# Patient Record
Sex: Male | Born: 1955 | Race: White | Hispanic: No | Marital: Married | Smoking: Never smoker
Health system: Southern US, Community
[De-identification: ages and names within clinical notes are randomized; demographics above are authoritative.]

## PROBLEM LIST (undated history)

## (undated) DIAGNOSIS — E7800 Pure hypercholesterolemia, unspecified: Secondary | ICD-10-CM

## (undated) DIAGNOSIS — M109 Gout, unspecified: Secondary | ICD-10-CM

## (undated) HISTORY — PX: APPENDECTOMY: SHX54

---

## 2006-10-06 ENCOUNTER — Encounter: Admission: RE | Admit: 2006-10-06 | Discharge: 2006-10-06 | Payer: Self-pay | Admitting: Family Medicine

## 2007-03-09 ENCOUNTER — Encounter: Admission: RE | Admit: 2007-03-09 | Discharge: 2007-03-09 | Payer: Self-pay | Admitting: Family Medicine

## 2012-12-28 ENCOUNTER — Other Ambulatory Visit: Payer: Self-pay | Admitting: Family Medicine

## 2012-12-28 ENCOUNTER — Ambulatory Visit
Admission: RE | Admit: 2012-12-28 | Discharge: 2012-12-28 | Disposition: A | Discharge status: Home / Self Care | Payer: BC Managed Care – PPO | Source: Ambulatory Visit | Attending: Family Medicine | Admitting: Family Medicine

## 2012-12-28 DIAGNOSIS — R609 Edema, unspecified: Secondary | ICD-10-CM

## 2012-12-28 DIAGNOSIS — T1490XA Injury, unspecified, initial encounter: Secondary | ICD-10-CM

## 2012-12-28 DIAGNOSIS — R52 Pain, unspecified: Secondary | ICD-10-CM

## 2013-04-16 ENCOUNTER — Other Ambulatory Visit: Payer: Self-pay | Admitting: Physician Assistant

## 2013-04-16 ENCOUNTER — Ambulatory Visit
Admission: RE | Admit: 2013-04-16 | Discharge: 2013-04-16 | Disposition: A | Discharge status: Home / Self Care | Payer: BC Managed Care – PPO | Source: Ambulatory Visit | Attending: Physician Assistant | Admitting: Physician Assistant

## 2013-04-16 DIAGNOSIS — R609 Edema, unspecified: Secondary | ICD-10-CM

## 2013-04-16 DIAGNOSIS — R52 Pain, unspecified: Secondary | ICD-10-CM

## 2014-06-16 ENCOUNTER — Emergency Department (HOSPITAL_COMMUNITY): Payer: BLUE CROSS/BLUE SHIELD

## 2014-06-16 ENCOUNTER — Encounter (HOSPITAL_COMMUNITY): Payer: Self-pay | Admitting: Emergency Medicine

## 2014-06-16 ENCOUNTER — Emergency Department (HOSPITAL_COMMUNITY)
Admission: EM | Admit: 2014-06-16 | Discharge: 2014-06-16 | Disposition: A | Discharge status: Home / Self Care | Payer: BLUE CROSS/BLUE SHIELD | Attending: Emergency Medicine | Admitting: Emergency Medicine

## 2014-06-16 DIAGNOSIS — K529 Noninfective gastroenteritis and colitis, unspecified: Secondary | ICD-10-CM | POA: Diagnosis not present

## 2014-06-16 DIAGNOSIS — Z8639 Personal history of other endocrine, nutritional and metabolic disease: Secondary | ICD-10-CM | POA: Diagnosis not present

## 2014-06-16 DIAGNOSIS — R109 Unspecified abdominal pain: Secondary | ICD-10-CM | POA: Diagnosis present

## 2014-06-16 DIAGNOSIS — Z79899 Other long term (current) drug therapy: Secondary | ICD-10-CM | POA: Diagnosis not present

## 2014-06-16 DIAGNOSIS — Z8739 Personal history of other diseases of the musculoskeletal system and connective tissue: Secondary | ICD-10-CM | POA: Insufficient documentation

## 2014-06-16 DIAGNOSIS — Z9089 Acquired absence of other organs: Secondary | ICD-10-CM | POA: Diagnosis not present

## 2014-06-16 HISTORY — DX: Pure hypercholesterolemia, unspecified: E78.00

## 2014-06-16 HISTORY — DX: Gout, unspecified: M10.9

## 2014-06-16 LAB — URINALYSIS, ROUTINE W REFLEX MICROSCOPIC
Bilirubin Urine: NEGATIVE
Glucose, UA: NEGATIVE mg/dL
Hgb urine dipstick: NEGATIVE
KETONES UR: NEGATIVE mg/dL
Leukocytes, UA: NEGATIVE
NITRITE: NEGATIVE
PH: 6 (ref 5.0–8.0)
PROTEIN: NEGATIVE mg/dL
SPECIFIC GRAVITY, URINE: 1.037 — AB (ref 1.005–1.030)
UROBILINOGEN UA: 0.2 mg/dL (ref 0.0–1.0)

## 2014-06-16 LAB — CBC WITH DIFFERENTIAL/PLATELET
BASOS PCT: 1 % (ref 0–1)
Basophils Absolute: 0.1 10*3/uL (ref 0.0–0.1)
EOS ABS: 0 10*3/uL (ref 0.0–0.7)
EOS PCT: 0 % (ref 0–5)
HEMATOCRIT: 44.9 % (ref 39.0–52.0)
Hemoglobin: 15.9 g/dL (ref 13.0–17.0)
LYMPHS ABS: 0.9 10*3/uL (ref 0.7–4.0)
LYMPHS PCT: 6 % — AB (ref 12–46)
MCH: 30.3 pg (ref 26.0–34.0)
MCHC: 35.4 g/dL (ref 30.0–36.0)
MCV: 85.5 fL (ref 78.0–100.0)
MONO ABS: 0.7 10*3/uL (ref 0.1–1.0)
MONOS PCT: 4 % (ref 3–12)
NEUTROS PCT: 89 % — AB (ref 43–77)
Neutro Abs: 14.8 10*3/uL — ABNORMAL HIGH (ref 1.7–7.7)
PLATELETS: 325 10*3/uL (ref 150–400)
RBC: 5.25 MIL/uL (ref 4.22–5.81)
RDW: 12.2 % (ref 11.5–15.5)
WBC: 16.6 10*3/uL — AB (ref 4.0–10.5)

## 2014-06-16 LAB — COMPREHENSIVE METABOLIC PANEL
ALBUMIN: 4.3 g/dL (ref 3.5–5.2)
ALK PHOS: 62 U/L (ref 39–117)
ALT: 33 U/L (ref 0–53)
AST: 30 U/L (ref 0–37)
Anion gap: 7 (ref 5–15)
BILIRUBIN TOTAL: 0.5 mg/dL (ref 0.3–1.2)
BUN: 15 mg/dL (ref 6–23)
CHLORIDE: 103 mmol/L (ref 96–112)
CO2: 27 mmol/L (ref 19–32)
Calcium: 9.3 mg/dL (ref 8.4–10.5)
Creatinine, Ser: 1 mg/dL (ref 0.50–1.35)
GFR calc Af Amer: >90 mL/min (ref >90)
GFR calc non Af Amer: 81 mL/min — ABNORMAL LOW (ref >90)
GLUCOSE: 153 mg/dL — AB (ref 70–99)
POTASSIUM: 4.2 mmol/L (ref 3.5–5.1)
SODIUM: 137 mmol/L (ref 135–145)
Total Protein: 7.6 g/dL (ref 6.0–8.3)

## 2014-06-16 LAB — LIPASE, BLOOD: LIPASE: 33 U/L (ref 11–59)

## 2014-06-16 LAB — I-STAT TROPONIN, ED: TROPONIN I, POC: 0.01 ng/mL (ref 0.00–0.08)

## 2014-06-16 MED ORDER — IOHEXOL 300 MG/ML  SOLN
100.0000 mL | Freq: Once | INTRAMUSCULAR | Status: AC | PRN
  Administered 2014-06-16: 100 mL via INTRAVENOUS

## 2014-06-16 MED ORDER — ONDANSETRON HCL 4 MG/2ML IJ SOLN
4.0000 mg | Freq: Once | INTRAMUSCULAR | Status: AC
  Administered 2014-06-16: 4 mg via INTRAVENOUS
  Filled 2014-06-16: qty 2

## 2014-06-16 MED ORDER — DICYCLOMINE HCL 20 MG PO TABS: 20.0000 mg | ORAL_TABLET | Freq: Two times a day (BID) | ORAL | Status: AC | PRN

## 2014-06-16 MED ORDER — DICYCLOMINE HCL 10 MG/ML IM SOLN
20.0000 mg | Freq: Once | INTRAMUSCULAR | Status: AC
  Administered 2014-06-16: 20 mg via INTRAMUSCULAR
  Filled 2014-06-16: qty 2

## 2014-06-16 MED ORDER — HYDROMORPHONE HCL 1 MG/ML IJ SOLN
1.0000 mg | Freq: Once | INTRAMUSCULAR | Status: AC
  Administered 2014-06-16: 1 mg via INTRAVENOUS
  Filled 2014-06-16: qty 1

## 2014-06-16 MED ORDER — DICYCLOMINE HCL 10 MG PO CAPS
10.0000 mg | ORAL_CAPSULE | Freq: Once | ORAL | Status: AC
  Administered 2014-06-16: 10 mg via ORAL
  Filled 2014-06-16: qty 1

## 2014-06-16 MED ORDER — HYDROCODONE-ACETAMINOPHEN 5-325 MG PO TABS: 1.0000 | ORAL_TABLET | Freq: Four times a day (QID) | ORAL | Status: DC | PRN

## 2014-06-16 MED ORDER — MORPHINE SULFATE 4 MG/ML IJ SOLN
4.0000 mg | Freq: Once | INTRAMUSCULAR | Status: AC
  Administered 2014-06-16: 4 mg via INTRAVENOUS
  Filled 2014-06-16: qty 1

## 2014-06-16 MED ORDER — IOHEXOL 300 MG/ML  SOLN
25.0000 mL | Freq: Once | INTRAMUSCULAR | Status: AC | PRN
  Administered 2014-06-16: 25 mL via ORAL

## 2014-06-16 MED ORDER — HYDROMORPHONE HCL 1 MG/ML IJ SOLN
0.5000 mg | Freq: Once | INTRAMUSCULAR | Status: AC
  Administered 2014-06-16: 0.5 mg via INTRAVENOUS
  Filled 2014-06-16: qty 1

## 2014-06-16 MED ORDER — ONDANSETRON 8 MG PO TBDP: ORAL_TABLET | ORAL | Status: AC

## 2014-06-16 NOTE — ED Provider Notes (Signed)
CSN: [id number]     Arrival date & time 06/16/14  0543 History   First MD Initiated Contact with Patient 06/16/14 0550     Chief Complaint  Patient presents with  . Abdominal Pain  . Emesis     (Consider location/radiation/quality/duration/timing/severity/associated sxs/prior Treatment) Patient is a 59 y.o. male presenting with abdominal pain and vomiting. The history is provided by the patient, medical records and a significant other. No language interpreter was used.  Abdominal Pain Associated symptoms: diarrhea, nausea and vomiting   Associated symptoms: no chest pain, no constipation, no cough, no dysuria, no fatigue, no fever, no hematuria and no shortness of breath   Emesis Associated symptoms: abdominal pain and diarrhea      Arthur Duncan is a 59 y.o. male  with a hx of gout, high cholesterol and appendcetomy presents to the Emergency Department complaining of gradual, persistent, progressively worsening in her eyes abdominal pain onset 8pm yesterday. Associated symptoms include nausea vomiting and diarrhea. Patient reports approximately 25 episodes of nonbloody nonbilious emesis and 5 episodes of watery diarrhea without melena or hematochezia.  Patient reports that his wife and other family members in the household have had the nor varices in the last several weeks. He reports he called his physician and has attempted to use a Phenergan suppository without relief of his pain or other symptoms. Nothing seems to make his symptoms better or worse. Patient denies fever, chills, headache, neck pain, chest pain, shortness of breath, weakness, dizziness, syncope, dysuria, hematuria.  Patient denies history of peptic ulcer disease, daily alcohol intake or chronic NSAID usage.  Past Medical History  Diagnosis Date  . Gout   . Hypercholesteremia    Past Surgical History  Procedure Laterality Date  . Appendectomy     No family history on file. History  Substance Use Topics  . Smoking  status: Never Smoker   . Smokeless tobacco: Not on file  . Alcohol Use: 1.2 oz/week    2 Glasses of wine per week     Comment: 3 days a week    Review of Systems  Constitutional: Negative for fever, diaphoresis, appetite change, fatigue and unexpected weight change.  HENT: Negative for mouth sores and trouble swallowing.   Respiratory: Negative for cough, chest tightness, shortness of breath, wheezing and stridor.   Cardiovascular: Negative for chest pain and palpitations.  Gastrointestinal: Positive for nausea, vomiting, abdominal pain and diarrhea. Negative for constipation, blood in stool, abdominal distention and rectal pain.  Genitourinary: Negative for dysuria, urgency, frequency, hematuria, flank pain and difficulty urinating.  Musculoskeletal: Negative for back pain, neck pain and neck stiffness.  Skin: Negative for rash.  Neurological: Negative for weakness.  Hematological: Negative for adenopathy.  Psychiatric/Behavioral: Negative for confusion.  All other systems reviewed and are negative.     Allergies  Review of patient's allergies indicates no known allergies.  Home Medications   Prior to Admission medications   Medication Sig Start Date End Date Taking? Authorizing Provider  promethazine (PHENERGAN) 25 MG suppository Place 25 mg rectally every 6 (six) hours as needed for nausea or vomiting.   Yes Historical Provider, MD  dicyclomine (BENTYL) 20 MG tablet Take 1 tablet (20 mg total) by mouth every 12 (twelve) hours as needed (abd cramping). 06/16/14   Izsak Meir, PA-C  HYDROcodone-acetaminophen (NORCO/VICODIN) 5-325 MG per tablet Take 1-2 tablets by mouth every 6 (six) hours as needed for moderate pain or severe pain. 06/16/14   Ismerai Bin, PA-C  ondansetron [facility]  ODT) 8 MG disintegrating tablet 8mg  ODT q4 hours prn nausea 06/16/14   Jem Castro, PA-C   BP 134/77 mmHg  Pulse 64  Temp(Src) 98.2 F (36.8 C) (Oral)  Resp 13  Ht 5\' 8"  (1.727  m)  Wt 178 lb (80.74 kg)  BMI 27.07 kg/m2  SpO2 96% Physical Exam  Constitutional: He appears well-developed and well-nourished. No distress.  Awake, alert, nontoxic appearance  HENT:  Head: Normocephalic and atraumatic.  Mouth/Throat: Oropharynx is clear and moist. No oropharyngeal exudate.  Eyes: Conjunctivae are normal. No scleral icterus.  Neck: Normal range of motion. Neck supple.  Cardiovascular: Normal rate, regular rhythm and intact distal pulses.   Pulmonary/Chest: Effort normal and breath sounds normal. No respiratory distress. He has no wheezes.  Equal chest expansion  Abdominal: Soft. Bowel sounds are normal. He exhibits no distension and no mass. There is generalized tenderness. There is no rebound, no guarding and no CVA tenderness.  Generalized abd tenderness without rebound, guarding or peritoneal signs No CVA tenderness  Musculoskeletal: Normal range of motion. He exhibits no edema.  Neurological: He is alert.  Speech is clear and goal oriented Moves extremities without ataxia  Skin: Skin is warm and dry. He is not diaphoretic.  Psychiatric: He has a normal mood and affect.  Nursing note and vitals reviewed.   ED Course  Procedures (including critical care time) Labs Review Labs Reviewed  CBC WITH DIFFERENTIAL/PLATELET - Abnormal; Notable for the following:    WBC 16.6 (*)    Neutrophils Relative % 89 (*)    Neutro Abs 14.8 (*)    Lymphocytes Relative 6 (*)    All other components within normal limits  COMPREHENSIVE METABOLIC PANEL - Abnormal; Notable for the following:    Glucose, Bld 153 (*)    GFR calc non Af Amer 81 (*)    All other components within normal limits  URINALYSIS, ROUTINE W REFLEX MICROSCOPIC - Abnormal; Notable for the following:    Specific Gravity, Urine 1.037 (*)    All other components within normal limits  LIPASE, BLOOD  I-STAT TROPOININ, ED    Imaging Review Ct Abdomen Pelvis W Contrast  06/16/2014   CLINICAL DATA:  Upper to  mid abdominal pain with emesis and diarrhea for 1 day  EXAM: CT ABDOMEN AND PELVIS WITH CONTRAST  TECHNIQUE: Multidetector CT imaging of the abdomen and pelvis was performed using the standard protocol following bolus administration of intravenous contrast. Oral contrast was also administered.  CONTRAST:  100mL OMNIPAQUE IOHEXOL 300 MG/ML  SOLN  COMPARISON:  None.  FINDINGS: There is mild bibasilar lung atelectatic change. There is a small hiatal hernia. Oral contrast in this area suggests its air may be a degree of gastroesophageal reflux.  No focal liver lesions are identified. Gallbladder is mildly distended. There is no appreciable gallbladder wall thickening. There is no biliary duct dilatation.  Spleen, pancreas, and adrenals appear normal.  There is a cyst arising from the posterior aspect of the left kidney measuring 2.4 x 1.5 cm. There is no other renal mass. There is no hydronephrosis on either side. There is no renal or ureteral calculus on either side.  In the pelvis, the urinary bladder is midline with normal wall thickness. There appears to be a hydrocele in the right scrotum. There is no pelvic mass or pelvic fluid collection. There are scattered sigmoid diverticula without diverticulitis. The appendix is absent.  There is no appreciable bowel obstruction. No free air or portal venous air. There  is no appreciable ascites, adenopathy, or abscess in the abdomen or pelvis. There is no abdominal aortic aneurysm. There are no blastic or lytic bone lesions.  IMPRESSION: Small hiatal hernia with apparent gastroesophageal reflux.  No bowel obstruction.  No abscess.  Appendix absent.  No renal or ureteral calculus.  No hydronephrosis.  Scattered sigmoid diverticula without diverticulitis.  Suspect right scrotal hydrocele.   Electronically Signed   By: Bretta Bang III M.D.   On: 06/16/2014 07:59     EKG Interpretation None      MDM   Final diagnoses:  Abdominal pain  Gastroenteritis   Arthur Duncan presents with nausea, vomiting and diarrhea. Abdomen is soft and generally tender. Patient reports he is very concerned about why he has such abdominal pain. Will obtain labs, imaging and reassess.  7:30AM Patient with leukocytosis of 16.6 but labs otherwise reassuring. In light of patient's persistent vomiting this may be reactive however will await CT scan.  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  No bowel obstruction, abscess, renal or ureteral calculus, hydronephrosis or diverticulitis. Patient status post appendectomy.  Patient reports he feels much better. Will by mouth trial and reassess.  Viral gastroenteritis.  9:16 AM Pt PO trial without emesis but with increase in abdominal pain/cramping. Will repeat dilaudid and trial bentyl.    10:11 AM Pain controlled and patient without further emesis here in the emergency department. He wishes for discharge home. We'll discharge home with Vicodin, Zofran and Bentyl.  Patient is nontoxic, nonseptic appearing, in no apparent distress.  Patient's pain and other symptoms adequately managed in emergency department.  Fluid bolus given.  Labs, imaging and vitals reviewed.  Patient does not meet the SIRS or Sepsis criteria.  On repeat exam patient does not have a surgical abdomin and there are no peritoneal signs.  No indication of appendicitis, bowel obstruction, bowel perforation, cholecystitis, diverticulitis.  Patient discharged home with symptomatic treatment and given strict instructions for follow-up with their primary care physician.    I have personally reviewed patient's vitals, nursing note and any pertinent labs or imaging.  I performed an undressed physical exam.    It has been determined that no acute conditions requiring further emergency intervention are present at this time. The patient/guardian have been advised of the diagnosis and plan. I reviewed all labs and imaging including any potential  incidental findings. We have discussed signs and symptoms that warrant return to the ED and they are listed in the discharge instructions.    Vital signs are stable at discharge.   BP 134/77 mmHg  Pulse 64  Temp(Src) 98.2 F (36.8 C) (Oral)  Resp 13  Ht  (1.727 m)  Wt 178 lb (80.74 kg)  BMI 27.07 kg/m2  SpO2 96%        Dierdre Forth, PA-C 06/16/14 1012  Olivia Mackie, MD 06/16/14 1820

## 2014-06-16 NOTE — Discharge Instructions (Signed)
1. Medications: zofran, vicodin, usual home medications 2. Treatment: rest, drink plenty of fluids, advance diet slowly 3. Follow Up: Please followup with your primary doctor in 2 days for discussion of your diagnoses and further evaluation after today's visit; if you do not have a primary care doctor use the resource guide provided to find one; Please return to the ER for persistent vomiting, high fevers or worsening symptoms   Norovirus Infection Norovirus illness is caused by a viral infection. The term norovirus refers to a group of viruses. Any of those viruses can cause norovirus illness. This illness is often referred to by other names such as viral gastroenteritis, stomach flu, and food poisoning. Anyone can get a norovirus infection. People can have the illness multiple times during their lifetime. CAUSES  Norovirus is found in the stool or vomit of infected people. It is easily spread from person to person (contagious). People with norovirus are contagious from the moment they begin feeling ill. They may remain contagious for as long as 3 days to 2 weeks after recovery. People can become infected with the virus in several ways. This includes:  Eating food or drinking liquids that are contaminated with norovirus.  Touching surfaces or objects contaminated with norovirus, and then placing your hand in your mouth.  Having direct contact with a person who is infected and shows symptoms. This may occur while caring for someone with illness or while sharing foods or eating utensils with someone who is ill. SYMPTOMS  Symptoms usually begin 1 to 2 days after ingestion of the virus. Symptoms may include:  Nausea.  Vomiting.  Diarrhea.  Stomach cramps.  Low-grade fever.  Chills.  Headache.  Muscle aches.  Tiredness. Most people with norovirus illness get better within 1 to 2 days. Some people become dehydrated because they cannot drink enough liquids to replace those lost from  vomiting and diarrhea. This is especially true for young children, the elderly, and others who are unable to care for themselves. DIAGNOSIS  Diagnosis is based on your symptoms and exam. Currently, only state public health laboratories have the ability to test for norovirus in stool or vomit. TREATMENT  No specific treatment exists for norovirus infections. No vaccine is available to prevent infections. Norovirus illness is usually brief in healthy people. If you are ill with vomiting and diarrhea, you should drink enough water and fluids to keep your urine clear or pale yellow. Dehydration is the most serious health effect that can result from this infection. By drinking oral rehydration solution (ORS), people can reduce their chance of becoming dehydrated. There are many commercially available pre-made and powdered ORS designed to safely rehydrate people. These may be recommended by your caregiver. Replace any new fluid losses from diarrhea or vomiting with ORS as follows:  If your child weighs 10 kg or less (22 lb or less), give 60 to 120 ml ( to  cup or 2 to 4 oz) of ORS for each diarrheal stool or vomiting episode.  If your child weighs more than 10 kg (more than 22 lb), give 120 to 240 ml ( to 1 cup or 4 to 8 oz) of ORS for each diarrheal stool or vomiting episode. HOME CARE INSTRUCTIONS   Follow all your caregiver's instructions.  Avoid sugar-free and alcoholic drinks while ill.  Only take over-the-counter or prescription medicines for pain, vomiting, diarrhea, or fever as directed by your caregiver. You can decrease your chances of coming in contact with norovirus or spreading it by  following these steps:  Frequently wash your hands, especially after using the toilet, changing diapers, and before eating or preparing food.  Carefully wash fruits and vegetables. Cook shellfish before eating them.  Do not prepare food for others while you are infected and for at least 3 days after  recovering from illness.  Thoroughly clean and disinfect contaminated surfaces immediately after an episode of illness using a bleach-based household cleaner.  Immediately remove and wash clothing or linens that may be contaminated with the virus.  Use the toilet to dispose of any vomit or stool. Make sure the surrounding area is kept clean.  Food that may have been contaminated by an ill person should be discarded. SEEK IMMEDIATE MEDICAL CARE IF:   You develop symptoms of dehydration that do not improve with fluid replacement. This may include:  Excessive sleepiness.  Lack of tears.  Dry mouth.  Dizziness when standing.  Weak pulse. Document Released: 07/09/2002 Document Revised: 07/11/2011 Document Reviewed: 08/10/2009 [facility] Patient Information 2015 [place], [place]. This information is not intended to replace advice given to you by your health care provider. Make sure you discuss any questions you have with your health care provider.

## 2014-06-16 NOTE — ED Notes (Signed)
Gave pt crackers and water for PO challenge 

## 2014-06-16 NOTE — ED Notes (Signed)
Pt arrives with c/o abdominal pain, emesis and diarrhea since around 2000 yesterday. Pain got worse at 2230, states its intermittent.

## 2014-06-20 ENCOUNTER — Emergency Department (HOSPITAL_COMMUNITY): Payer: BLUE CROSS/BLUE SHIELD

## 2014-06-20 ENCOUNTER — Encounter (HOSPITAL_COMMUNITY): Payer: Self-pay

## 2014-06-20 ENCOUNTER — Inpatient Hospital Stay (HOSPITAL_COMMUNITY)
Admission: EM | Admit: 2014-06-20 | Discharge: 2014-06-22 | DRG: 419 | Disposition: A | Discharge status: Home / Self Care | Payer: BLUE CROSS/BLUE SHIELD | Attending: General Surgery | Admitting: General Surgery

## 2014-06-20 DIAGNOSIS — E780 Pure hypercholesterolemia: Secondary | ICD-10-CM | POA: Diagnosis present

## 2014-06-20 DIAGNOSIS — M109 Gout, unspecified: Secondary | ICD-10-CM | POA: Diagnosis present

## 2014-06-20 DIAGNOSIS — K8000 Calculus of gallbladder with acute cholecystitis without obstruction: Principal | ICD-10-CM | POA: Diagnosis present

## 2014-06-20 DIAGNOSIS — Z9049 Acquired absence of other specified parts of digestive tract: Secondary | ICD-10-CM | POA: Diagnosis present

## 2014-06-20 DIAGNOSIS — R1011 Right upper quadrant pain: Secondary | ICD-10-CM

## 2014-06-20 DIAGNOSIS — K819 Cholecystitis, unspecified: Secondary | ICD-10-CM | POA: Diagnosis present

## 2014-06-20 DIAGNOSIS — F419 Anxiety disorder, unspecified: Secondary | ICD-10-CM | POA: Diagnosis present

## 2014-06-20 DIAGNOSIS — K429 Umbilical hernia without obstruction or gangrene: Secondary | ICD-10-CM | POA: Diagnosis present

## 2014-06-20 LAB — COMPREHENSIVE METABOLIC PANEL
ALK PHOS: 204 U/L — AB (ref 39–117)
ALT: 113 U/L — AB (ref 0–53)
ANION GAP: 11 (ref 5–15)
AST: 104 U/L — ABNORMAL HIGH (ref 0–37)
Albumin: 3.1 g/dL — ABNORMAL LOW (ref 3.5–5.2)
BILIRUBIN TOTAL: 1.2 mg/dL (ref 0.3–1.2)
BUN: 8 mg/dL (ref 6–23)
CHLORIDE: 95 mmol/L — AB (ref 96–112)
CO2: 27 mmol/L (ref 19–32)
Calcium: 8.7 mg/dL (ref 8.4–10.5)
Creatinine, Ser: 0.98 mg/dL (ref 0.50–1.35)
GFR, EST NON AFRICAN AMERICAN: 89 mL/min — AB (ref >90)
Glucose, Bld: 179 mg/dL — ABNORMAL HIGH (ref 70–99)
POTASSIUM: 4 mmol/L (ref 3.5–5.1)
SODIUM: 133 mmol/L — AB (ref 135–145)
Total Protein: 7.2 g/dL (ref 6.0–8.3)

## 2014-06-20 LAB — URINALYSIS, ROUTINE W REFLEX MICROSCOPIC
Bilirubin Urine: NEGATIVE
GLUCOSE, UA: NEGATIVE mg/dL
HGB URINE DIPSTICK: NEGATIVE
KETONES UR: NEGATIVE mg/dL
Leukocytes, UA: NEGATIVE
Nitrite: NEGATIVE
PH: 6 (ref 5.0–8.0)
PROTEIN: NEGATIVE mg/dL
Specific Gravity, Urine: 1.009 (ref 1.005–1.030)
Urobilinogen, UA: 1 mg/dL (ref 0.0–1.0)

## 2014-06-20 LAB — CBC
HCT: 40.7 % (ref 39.0–52.0)
Hemoglobin: 14 g/dL (ref 13.0–17.0)
MCH: 30.2 pg (ref 26.0–34.0)
MCHC: 34.4 g/dL (ref 30.0–36.0)
MCV: 87.9 fL (ref 78.0–100.0)
Platelets: 325 10*3/uL (ref 150–400)
RBC: 4.63 MIL/uL (ref 4.22–5.81)
RDW: 12.2 % (ref 11.5–15.5)
WBC: 14.3 10*3/uL — AB (ref 4.0–10.5)

## 2014-06-20 LAB — I-STAT CG4 LACTIC ACID, ED: Lactic Acid, Venous: 2.39 mmol/L (ref 0.5–2.0)

## 2014-06-20 LAB — LIPASE, BLOOD: Lipase: 24 U/L (ref 11–59)

## 2014-06-20 MED ORDER — ACETAMINOPHEN 650 MG RE SUPP
650.0000 mg | Freq: Four times a day (QID) | RECTAL | Status: DC | PRN
  Administered 2014-06-21: 650 mg via RECTAL
  Filled 2014-06-20: qty 1

## 2014-06-20 MED ORDER — HYDROMORPHONE HCL 1 MG/ML IJ SOLN
0.5000 mg | INTRAMUSCULAR | Status: DC | PRN
  Administered 2014-06-21 (×3): 0.5 mg via INTRAVENOUS
  Filled 2014-06-20 (×3): qty 1

## 2014-06-20 MED ORDER — LORAZEPAM 2 MG/ML IJ SOLN
1.0000 mg | Freq: Once | INTRAMUSCULAR | Status: AC
  Administered 2014-06-20: 1 mg via INTRAVENOUS
  Filled 2014-06-20: qty 1

## 2014-06-20 MED ORDER — SODIUM CHLORIDE 0.9 % IV SOLN
INTRAVENOUS | Status: DC
Start: 2014-06-20 — End: 2014-06-22
  Administered 2014-06-20 – 2014-06-21 (×2): via INTRAVENOUS

## 2014-06-20 MED ORDER — PIPERACILLIN-TAZOBACTAM 3.375 G IVPB
3.3750 g | Freq: Three times a day (TID) | INTRAVENOUS | Status: DC
  Administered 2014-06-21 – 2014-06-22 (×4): 3.375 g via INTRAVENOUS
  Filled 2014-06-20 (×6): qty 50

## 2014-06-20 MED ORDER — ONDANSETRON HCL 4 MG/2ML IJ SOLN: 4.0000 mg | Freq: Four times a day (QID) | INTRAMUSCULAR | Status: DC | PRN

## 2014-06-20 MED ORDER — HYDROMORPHONE HCL 1 MG/ML IJ SOLN
1.0000 mg | Freq: Once | INTRAMUSCULAR | Status: AC
  Administered 2014-06-20: 1 mg via INTRAVENOUS
  Filled 2014-06-20: qty 1

## 2014-06-20 MED ORDER — ONDANSETRON HCL 4 MG/2ML IJ SOLN
4.0000 mg | Freq: Once | INTRAMUSCULAR | Status: AC
  Administered 2014-06-20: 4 mg via INTRAVENOUS
  Filled 2014-06-20: qty 2

## 2014-06-20 MED ORDER — SODIUM CHLORIDE 0.9 % IV BOLUS (SEPSIS)
1000.0000 mL | Freq: Once | INTRAVENOUS | Status: AC
  Administered 2014-06-20: 1000 mL via INTRAVENOUS

## 2014-06-20 MED ORDER — ACETAMINOPHEN 325 MG PO TABS
650.0000 mg | ORAL_TABLET | Freq: Four times a day (QID) | ORAL | Status: DC | PRN
  Administered 2014-06-22: 650 mg via ORAL
  Filled 2014-06-20: qty 2

## 2014-06-20 MED ORDER — PIPERACILLIN-TAZOBACTAM 3.375 G IVPB 30 MIN
3.3750 g | Freq: Once | INTRAVENOUS | Status: AC
  Administered 2014-06-20: 3.375 g via INTRAVENOUS
  Filled 2014-06-20: qty 50

## 2014-06-20 MED ORDER — HEPARIN SODIUM (PORCINE) 5000 UNIT/ML IJ SOLN
5000.0000 [IU] | Freq: Three times a day (TID) | INTRAMUSCULAR | Status: DC
  Administered 2014-06-21: 5000 [IU] via SUBCUTANEOUS
  Filled 2014-06-20 (×4): qty 1

## 2014-06-20 MED ORDER — ACETAMINOPHEN 500 MG PO TABS
1000.0000 mg | ORAL_TABLET | Freq: Once | ORAL | Status: AC
  Administered 2014-06-20: 1000 mg via ORAL
  Filled 2014-06-20: qty 2

## 2014-06-20 NOTE — ED Notes (Signed)
Pt presents with continued abdominal pain from here on Saturday.  Pt reports abdominal pain was generalized at onset, reports pain is mainly on R side.  Pt denies any nausea, reports decreased appetite; reports urine is darker, no bowel movement since Sunday.

## 2014-06-20 NOTE — Progress Notes (Signed)
ANTIBIOTIC CONSULT NOTE - INITIAL  Pharmacy Consult for zosyn Indication: cholecystitis  No Known Allergies  Patient Measurements:    Body Weight: 80.7 kg  Vital Signs: Temp: 100.6 F (38.1 C) (02/19 1814) Temp Source: Oral (02/19 1814) BP: 113/66 mmHg (02/19 2315) Pulse Rate: 67 (02/19 2315) Intake/Output from previous day:   Intake/Output from this shift:    Labs:  Recent Labs  06/20/14 1614  WBC 14.3*  HGB 14.0  PLT 325  CREATININE 0.98   Estimated Creatinine Clearance: 79.5 mL/min (by C-G formula based on Cr of 0.98). No results for input(s): VANCOTROUGH, VANCOPEAK, VANCORANDOM, GENTTROUGH, GENTPEAK, GENTRANDOM, TOBRATROUGH, TOBRAPEAK, TOBRARND, AMIKACINPEAK, AMIKACINTROU, AMIKACIN in the last 72 hours.   Microbiology: No results found for this or any previous visit (from the past 720 hour(s)).  Medical History: Past Medical History  Diagnosis Date  . Gout   . Hypercholesteremia     Medications:  Prescriptions prior to admission  Medication Sig Dispense Refill Last Dose  . allopurinol (ZYLOPRIM) 100 MG tablet Take 100 mg by mouth daily.   06/20/2014 at Unknown time  . ciprofloxacin (CIPRO) 500 MG tablet Take 500 mg by mouth 2 (two) times daily. Started 06/19/14, for 10 days ending 06/28/14   06/20/2014 at Unknown time  . dicyclomine (BENTYL) 20 MG tablet Take 1 tablet (20 mg total) by mouth every 12 (twelve) hours as needed (abd cramping). 20 tablet 0 06/20/2014 at Unknown time  . HYDROcodone-acetaminophen (NORCO/VICODIN) 5-325 MG per tablet Take 1-2 tablets by mouth every 6 (six) hours as needed for moderate pain or severe pain. 6 tablet 0 06/20/2014 at Unknown time  . ibuprofen (ADVIL,MOTRIN) 200 MG tablet Take 400 mg by mouth every 6 (six) hours as needed.   Past Week at Unknown time  . ondansetron (ZOFRAN ODT) 8 MG disintegrating tablet 8mg  ODT q4 hours prn nausea 10 tablet 0 06/19/2014 at Unknown time   Assessment: 59 yo man to start zosyn for probable  cholecystitis.  His CrCl ~80 ml/min.  He received one dose of zosyn earlier today ~21:30.    Goal of Therapy:  Eradication of infection  Plan:  Zosyn 3.375 gm IV q8 hours IE. F/u renal function, cultures and clinical course Plan lap chole tomorrow.  Thanks for allowing pharmacy to be a part of this patient's care.  Talbert CageLora Dylyn Mclaren, PharmD Clinical Pharmacist, [phone]  06/20/2014,11:44 PM

## 2014-06-20 NOTE — ED Provider Notes (Signed)
CSN: [id number]     Arrival date & time 06/20/14  1350 History   First MD Initiated Contact with Patient 06/20/14 1551     Chief Complaint  Patient presents with  . Abdominal Pain     (Consider location/radiation/quality/duration/timing/severity/associated sxs/prior Treatment) Patient is a 59 y.o. male presenting with abdominal pain. The history is provided by the patient.  Abdominal Pain Pain location:  RUQ Pain quality: not aching, not sharp, not shooting and not throbbing   Pain radiates to:  Does not radiate Pain severity:  Moderate Onset quality:  Gradual Timing:  Constant Progression:  Worsening Chronicity:  Recurrent Context: recent illness (recnet GI illness)   Context: not eating   Relieved by:  Nothing Worsened by:  Nothing tried Ineffective treatments: narcotics. Associated symptoms: fever and nausea   Associated symptoms: no cough, no diarrhea, no shortness of breath and no vomiting  Fatigue: for 2 days, last one about 24 hours ago.     Past Medical History  Diagnosis Date  . Gout   . Hypercholesteremia    Past Surgical History  Procedure Laterality Date  . Appendectomy     History reviewed. No pertinent family history. History  Substance Use Topics  . Smoking status: Never Smoker   . Smokeless tobacco: Not on file  . Alcohol Use: 1.2 oz/week    2 Glasses of wine per week     Comment: 3 days a week    Review of Systems  Constitutional: Positive for fever. Fatigue: for 2 days, last one about 24 hours ago.  Respiratory: Negative for cough and shortness of breath.   Gastrointestinal: Positive for nausea and abdominal pain. Negative for vomiting and diarrhea.  Musculoskeletal: Positive for neck stiffness.  All other systems reviewed and are negative.     Allergies  Review of patient's allergies indicates no known allergies.  Home Medications   Prior to Admission medications   Medication Sig Start Date End Date Taking? Authorizing Provider   dicyclomine (BENTYL) 20 MG tablet Take 1 tablet (20 mg total) by mouth every 12 (twelve) hours as needed (abd cramping). 06/16/14   Hannah Muthersbaugh, PA-C  HYDROcodone-acetaminophen (NORCO/VICODIN) 5-325 MG per tablet Take 1-2 tablets by mouth every 6 (six) hours as needed for moderate pain or severe pain. 06/16/14   Hannah Muthersbaugh, PA-C  ondansetron (ZOFRAN ODT) 8 MG disintegrating tablet  ODT q4 hours prn nausea 06/16/14   Hannah Muthersbaugh, PA-C  promethazine (PHENERGAN) 25 MG suppository Place 25 mg rectally every 6 (six) hours as needed for nausea or vomiting.    Historical Provider, MD   BP 147/113 mmHg  Pulse 80  Temp(Src) 99 F (37.2 C) (Oral)  Resp 17  SpO2 97% Physical Exam  Constitutional: He is oriented to person, place, and time. He appears well-developed and well-nourished. No distress.  HENT:  Head: Normocephalic and atraumatic.  Mouth/Throat: Oropharynx is clear and moist. No oropharyngeal exudate.  Eyes: EOM are normal. Pupils are equal, round, and reactive to light.  Neck: Normal range of motion. Neck supple.  Cardiovascular: Normal rate and regular rhythm.  Exam reveals no friction rub.   No murmur heard. Pulmonary/Chest: Effort normal and breath sounds normal. No respiratory distress. He has no wheezes. He has no rales.  Abdominal: He exhibits no distension. There is no tenderness. There is no rebound.  Musculoskeletal: Normal range of motion. He exhibits no edema.  Neurological: He is alert and oriented to person, place, and time. No cranial nerve deficit. He exhibits normal  muscle tone. Coordination normal.  Skin: Skin is warm. No rash noted. He is not diaphoretic.  Nursing note and vitals reviewed.   ED Course  Procedures (including critical care time) Labs Review Labs Reviewed  CBC - Abnormal; Notable for the following:    WBC 14.3 (*)    All other components within normal limits  COMPREHENSIVE METABOLIC PANEL  LIPASE, BLOOD  URINALYSIS,  ROUTINE W REFLEX MICROSCOPIC  I-STAT CG4 LACTIC ACID, ED    Imaging Review [place] Abdomen Complete  06/20/2014   CLINICAL DATA:  Right upper quadrant pain.  EXAM: ULTRASOUND ABDOMEN COMPLETE  COMPARISON:  CT abdomen and pelvis, 06/16/2014  FINDINGS: Gallbladder: There is dependent sludge but no shadowing stones. The wall is thickened to 5 mm with wall edema. Patient is tender to transducer pressure over the gallbladder.  Common bile duct: Diameter: 4.7 mm.  No evidence of a duct stone.  Liver: No focal lesion identified. Within normal limits in parenchymal echogenicity.  IVC: No abnormality visualized.  Pancreas: Limited visualization due to overlying bowel gas. Portions of the pancreas visualized are unremarkable.  Spleen: Size and appearance within normal limits.  Right Kidney: Length: 12.7 cm. Echogenicity within normal limits. No mass or hydronephrosis visualized.  Left Kidney: Length: 12.3 cm. Echogenicity within normal limits. No mass or hydronephrosis visualized.  Abdominal aorta: No aneurysm visualized.  Other findings: None.  IMPRESSION: 1. Gallbladder sludge, gallbladder wall thickening/edema and a positive sonographic Murphy's sign. Findings support acute cholecystitis despite the lack of visualization of a shadowing stone. 2. No other acute findings.  No other abnormalities.   Electronically Signed   By: Amie Portlandavid  Ormond M.D.   On: 06/20/2014 21:05     EKG Interpretation None      MDM   Final diagnoses:  RUQ pain  Cholecystitis  52M here with abdominal pain, nausea for past several days. Seen 4 days ago for N/V/D, had negative CT scan, mild leukocytosis. Was sent home with symptomatic control. States vomiting has stopped, but now pain has gone from generalized to localized in the RUQ.  AFVSS here. On exam, RUQ pain with rebound and guarding, mild diffuse pain in other areas of the abdomen without any other rebound or guarding. Will US belly, give pain meds, repeat labs. US here show acute  cholecystitis. Surgery consulted and admitting. Zosyn given.   Arthur MochaBlair Zeki Bedrosian, MD 06/21/14 [phone]

## 2014-06-20 NOTE — ED Notes (Signed)
Ultrasound called to inquire about when pt is able to go.

## 2014-06-20 NOTE — ED Notes (Signed)
Pts wife asking if patient needs to take his home dose of cipro. Spoke with Dr. Gwendolyn GrantWalden in regards to this and patients elevated temperature and c/o pain. Meds ordered for both, Dr. Gwendolyn GrantWalden advised cipro from home can be held at this time, pt and family made aware.

## 2014-06-20 NOTE — ED Notes (Signed)
Walden, MD notified of abnormal lab test results 

## 2014-06-20 NOTE — ED Notes (Signed)
MD at bedside. 

## 2014-06-20 NOTE — H&P (Signed)
Arthur Duncan is an 59 y.o. male.   Chief Complaint: ab pain HPI: 46 yom who was in er on 2/15 with upper abdominal pain, n/v/diarrhea. Underwent ct scan that showed distended gb but was otherwise fairly normal.  Thought to have gastroenteritis and was sent home. He has continued to have pain that is now more in ruq. He has had fevers at home and was not getting better leading to return to er.  He has undergone [place] that appears to have sludge and be c/w cholecystitis.    Past Medical History  Diagnosis Date  . Gout   . Hypercholesteremia     Past Surgical History  Procedure Laterality Date  . Appendectomy      History reviewed. No pertinent family history. Social History:  reports that he has never smoked. He does not have any smokeless tobacco history on file. He reports that he drinks about 1.2 oz of alcohol per week. He reports that he does not use illicit drugs.  Allergies: No Known Allergies  meds none  Results for orders placed or performed during the hospital encounter of 06/20/14 (from the past 48 hour(s))  CBC     Status: Abnormal   Collection Time: 06/20/14  4:14 PM  Result Value Ref Range   WBC 14.3 (H) 4.0 - 10.5 K/uL   RBC 4.63 4.22 - 5.81 MIL/uL   Hemoglobin 14.0 13.0 - 17.0 g/dL   HCT 40.7 39.0 - 52.0 %   MCV 87.9 78.0 - 100.0 fL   MCH 30.2 26.0 - 34.0 pg   MCHC 34.4 30.0 - 36.0 g/dL   RDW 12.2 11.5 - 15.5 %   Platelets 325 150 - 400 K/uL  Comprehensive metabolic panel     Status: Abnormal   Collection Time: 06/20/14  4:14 PM  Result Value Ref Range   Sodium 133 (L) 135 - 145 mmol/L   Potassium 4.0 3.5 - 5.1 mmol/L   Chloride 95 (L) 96 - 112 mmol/L   CO2 27 19 - 32 mmol/L   Glucose, Bld 179 (H) 70 - 99 mg/dL   BUN 8 6 - 23 mg/dL   Creatinine, Ser 0.98 0.50 - 1.35 mg/dL   Calcium 8.7 8.4 - 10.5 mg/dL   Total Protein 7.2 6.0 - 8.3 g/dL   Albumin 3.1 (L) 3.5 - 5.2 g/dL   AST 104 (H) 0 - 37 U/L   ALT 113 (H) 0 - 53 U/L   Alkaline Phosphatase 204 (H) 39 - 117  U/L   Total Bilirubin 1.2 0.3 - 1.2 mg/dL   GFR calc non Af Amer 89 (L) >90 mL/min   GFR calc Af Amer >90 >90 mL/min    Comment: (NOTE) The eGFR has been calculated using the CKD EPI equation. This calculation has not been validated in all clinical situations. eGFR's persistently <90 mL/min signify possible Chronic Kidney Disease.    Anion gap 11 5 - 15  Lipase, blood     Status: None   Collection Time: 06/20/14  4:14 PM  Result Value Ref Range   Lipase 24 11 - 59 U/L  I-Stat CG4 Lactic Acid, ED     Status: Abnormal   Collection Time: 06/20/14  4:46 PM  Result Value Ref Range   Lactic Acid, Venous 2.39 (HH) 0.5 - 2.0 mmol/L   Comment NOTIFIED PHYSICIAN   Urinalysis, Routine w reflex microscopic     Status: None   Collection Time: 06/20/14  4:55 PM  Result Value Ref Range  Color, Urine YELLOW YELLOW   APPearance CLEAR CLEAR   Specific Gravity, Urine 1.009 1.005 - 1.030   pH 6.0 5.0 - 8.0   Glucose, UA NEGATIVE NEGATIVE mg/dL   Hgb urine dipstick NEGATIVE NEGATIVE   Bilirubin Urine NEGATIVE NEGATIVE   Ketones, ur NEGATIVE NEGATIVE mg/dL   Protein, ur NEGATIVE NEGATIVE mg/dL   Urobilinogen, UA 1.0 0.0 - 1.0 mg/dL   Nitrite NEGATIVE NEGATIVE   Leukocytes, UA NEGATIVE NEGATIVE    Comment: MICROSCOPIC NOT DONE ON URINES WITH NEGATIVE PROTEIN, BLOOD, LEUKOCYTES, NITRITE, OR GLUCOSE <1000 mg/dL.   US Abdomen Complete  06/20/2014   CLINICAL DATA:  Right upper quadrant pain.  EXAM: ULTRASOUND ABDOMEN COMPLETE  COMPARISON:  CT abdomen and pelvis, 06/16/2014  FINDINGS: Gallbladder: There is dependent sludge but no shadowing stones. The wall is thickened to 5 mm with wall edema. Patient is tender to transducer pressure over the gallbladder.  Common bile duct: Diameter: 4.7 mm.  No evidence of a duct stone.  Liver: No focal lesion identified. Within normal limits in parenchymal echogenicity.  IVC: No abnormality visualized.  Pancreas: Limited visualization due to overlying bowel gas.  Portions of the pancreas visualized are unremarkable.  Spleen: Size and appearance within normal limits.  Right Kidney: Length: 12.7 cm. Echogenicity within normal limits. No mass or hydronephrosis visualized.  Left Kidney: Length: 12.3 cm. Echogenicity within normal limits. No mass or hydronephrosis visualized.  Abdominal aorta: No aneurysm visualized.  Other findings: None.  IMPRESSION: 1. Gallbladder sludge, gallbladder wall thickening/edema and a positive sonographic Murphy's sign. Findings support acute cholecystitis despite the lack of visualization of a shadowing stone. 2. No other acute findings.  No other abnormalities.   Electronically Signed   By: Arthur Duncan M.D.   On: 06/20/2014 21:05    Review of Systems  Constitutional: Positive for fever and chills.  Respiratory: Negative for shortness of breath.   Cardiovascular: Negative for chest pain.  Gastrointestinal: Positive for nausea, vomiting, abdominal pain and diarrhea.    Blood pressure 124/72, pulse 78, temperature 100.6 F (38.1 C), temperature source Oral, resp. rate 18, SpO2 98 %. Physical Exam  Constitutional: He appears well-developed and well-nourished.  Eyes: No scleral icterus.  Neck: Neck supple.  Cardiovascular: Normal rate, regular rhythm and normal heart sounds.   Respiratory: Effort normal and breath sounds normal. He has no wheezes. He has no rales.  GI: Soft. Bowel sounds are normal. There is tenderness in the right upper quadrant. There is positive Murphy's sign. No hernia.       Assessment/Plan Cholecystitis  He appears to have cholecystitis by exam and [place]. He has been started on abx. I discussed lap chole tomorrow with one of my partners.   Arthur Duncan 06/20/2014, 10:10 PM

## 2014-06-20 NOTE — ED Notes (Signed)
Patient transported to Ultrasound 

## 2014-06-20 NOTE — ED Notes (Signed)
Pt c/o right sided abd pain that began Sunday night, pt seen here early Monday morning for same. States he had episode of diarrhea on Sunday, none since. C/o constant nausea with no vomiting and states he has not been able to eat or drink much, c/o dark urine as well, denies pain with urination. Pt alert, oriented, nad

## 2014-06-21 ENCOUNTER — Inpatient Hospital Stay (HOSPITAL_COMMUNITY): Payer: BLUE CROSS/BLUE SHIELD | Admitting: Anesthesiology

## 2014-06-21 ENCOUNTER — Encounter (HOSPITAL_COMMUNITY): Admission: EM | Disposition: A | Discharge status: Home / Self Care | Payer: Self-pay | Source: Home / Self Care

## 2014-06-21 ENCOUNTER — Inpatient Hospital Stay (HOSPITAL_COMMUNITY): Payer: BLUE CROSS/BLUE SHIELD

## 2014-06-21 ENCOUNTER — Encounter (HOSPITAL_COMMUNITY): Payer: Self-pay | Admitting: Anesthesiology

## 2014-06-21 HISTORY — PX: CHOLECYSTECTOMY: SHX55

## 2014-06-21 LAB — SURGICAL PCR SCREEN
MRSA, PCR: NEGATIVE
STAPHYLOCOCCUS AUREUS: NEGATIVE

## 2014-06-21 LAB — COMPREHENSIVE METABOLIC PANEL
ALBUMIN: 2.7 g/dL — AB (ref 3.5–5.2)
ALT: 119 U/L — ABNORMAL HIGH (ref 0–53)
ANION GAP: 6 (ref 5–15)
AST: 111 U/L — AB (ref 0–37)
Alkaline Phosphatase: 248 U/L — ABNORMAL HIGH (ref 39–117)
BUN: 8 mg/dL (ref 6–23)
CHLORIDE: 100 mmol/L (ref 96–112)
CO2: 30 mmol/L (ref 19–32)
Calcium: 8.7 mg/dL (ref 8.4–10.5)
Creatinine, Ser: 0.99 mg/dL (ref 0.50–1.35)
GFR calc non Af Amer: 88 mL/min — ABNORMAL LOW (ref >90)
Glucose, Bld: 106 mg/dL — ABNORMAL HIGH (ref 70–99)
Potassium: 3.8 mmol/L (ref 3.5–5.1)
Sodium: 136 mmol/L (ref 135–145)
TOTAL PROTEIN: 6.8 g/dL (ref 6.0–8.3)
Total Bilirubin: 1.2 mg/dL (ref 0.3–1.2)

## 2014-06-21 SURGERY — LAPAROSCOPIC CHOLECYSTECTOMY WITH INTRAOPERATIVE CHOLANGIOGRAM
Anesthesia: General | Site: Abdomen

## 2014-06-21 MED ORDER — ONDANSETRON HCL 4 MG/2ML IJ SOLN
INTRAMUSCULAR | Status: DC | PRN
  Administered 2014-06-21: 4 mg via INTRAVENOUS

## 2014-06-21 MED ORDER — FENTANYL CITRATE 0.05 MG/ML IJ SOLN
INTRAMUSCULAR | Status: AC
  Filled 2014-06-21: qty 5

## 2014-06-21 MED ORDER — ROCURONIUM BROMIDE 100 MG/10ML IV SOLN
INTRAVENOUS | Status: DC | PRN
  Administered 2014-06-21: 40 mg via INTRAVENOUS
  Administered 2014-06-21: 10 mg via INTRAVENOUS

## 2014-06-21 MED ORDER — PROPOFOL 10 MG/ML IV BOLUS
INTRAVENOUS | Status: AC
  Filled 2014-06-21: qty 20

## 2014-06-21 MED ORDER — MIDAZOLAM HCL 5 MG/5ML IJ SOLN
INTRAMUSCULAR | Status: DC | PRN
  Administered 2014-06-21: 2 mg via INTRAVENOUS

## 2014-06-21 MED ORDER — DEXAMETHASONE SODIUM PHOSPHATE 4 MG/ML IJ SOLN
INTRAMUSCULAR | Status: DC | PRN
  Administered 2014-06-21: 8 mg via INTRAVENOUS

## 2014-06-21 MED ORDER — HEMOSTATIC AGENTS (NO CHARGE) OPTIME
TOPICAL | Status: DC | PRN
  Administered 2014-06-21: 2 via TOPICAL

## 2014-06-21 MED ORDER — NEOSTIGMINE METHYLSULFATE 10 MG/10ML IV SOLN
INTRAVENOUS | Status: AC
  Filled 2014-06-21: qty 1

## 2014-06-21 MED ORDER — ONDANSETRON HCL 4 MG/2ML IJ SOLN
INTRAMUSCULAR | Status: AC
  Filled 2014-06-21: qty 2

## 2014-06-21 MED ORDER — PROPOFOL 10 MG/ML IV BOLUS
INTRAVENOUS | Status: DC | PRN
  Administered 2014-06-21: 170 mg via INTRAVENOUS

## 2014-06-21 MED ORDER — IOHEXOL 300 MG/ML  SOLN
INTRAMUSCULAR | Status: DC | PRN
  Administered 2014-06-21: 7.5 mL

## 2014-06-21 MED ORDER — ONDANSETRON HCL 4 MG/2ML IJ SOLN: 4.0000 mg | Freq: Four times a day (QID) | INTRAMUSCULAR | Status: DC | PRN

## 2014-06-21 MED ORDER — LACTATED RINGERS IV SOLN
INTRAVENOUS | Status: DC
  Administered 2014-06-21: 12:00:00 via INTRAVENOUS

## 2014-06-21 MED ORDER — ONDANSETRON HCL 4 MG PO TABS
4.0000 mg | ORAL_TABLET | Freq: Four times a day (QID) | ORAL | Status: DC | PRN
Start: 2014-06-21 — End: 2014-06-22

## 2014-06-21 MED ORDER — NEOSTIGMINE METHYLSULFATE 10 MG/10ML IV SOLN
INTRAVENOUS | Status: DC | PRN
  Administered 2014-06-21: 2 mg via INTRAVENOUS

## 2014-06-21 MED ORDER — FENTANYL CITRATE 0.05 MG/ML IJ SOLN
INTRAMUSCULAR | Status: AC
  Filled 2014-06-21: qty 2

## 2014-06-21 MED ORDER — ONDANSETRON HCL 4 MG/2ML IJ SOLN
4.0000 mg | Freq: Once | INTRAMUSCULAR | Status: AC | PRN
  Administered 2014-06-21: 4 mg via INTRAVENOUS

## 2014-06-21 MED ORDER — MIDAZOLAM HCL 2 MG/2ML IJ SOLN
INTRAMUSCULAR | Status: AC
  Filled 2014-06-21: qty 2

## 2014-06-21 MED ORDER — ALLOPURINOL 100 MG PO TABS
100.0000 mg | ORAL_TABLET | Freq: Every day | ORAL | Status: DC
  Administered 2014-06-21 – 2014-06-22 (×2): 100 mg via ORAL
  Filled 2014-06-21 (×2): qty 1

## 2014-06-21 MED ORDER — GLYCOPYRROLATE 0.2 MG/ML IJ SOLN
INTRAMUSCULAR | Status: DC | PRN
  Administered 2014-06-21: 0.4 mg via INTRAVENOUS

## 2014-06-21 MED ORDER — SODIUM CHLORIDE 0.9 % IR SOLN
Status: DC | PRN
  Administered 2014-06-21: 2000 mL

## 2014-06-21 MED ORDER — OXYCODONE-ACETAMINOPHEN 5-325 MG PO TABS
1.0000 | ORAL_TABLET | ORAL | Status: DC | PRN
  Administered 2014-06-22: 1 via ORAL
  Filled 2014-06-21: qty 1

## 2014-06-21 MED ORDER — HYDROMORPHONE HCL 1 MG/ML IJ SOLN
1.0000 mg | INTRAMUSCULAR | Status: DC | PRN
  Administered 2014-06-21 (×2): 1 mg via INTRAVENOUS
  Filled 2014-06-21 (×2): qty 1

## 2014-06-21 MED ORDER — BUPIVACAINE-EPINEPHRINE (PF) 0.25% -1:200000 IJ SOLN
INTRAMUSCULAR | Status: AC
  Filled 2014-06-21: qty 30

## 2014-06-21 MED ORDER — FENTANYL CITRATE 0.05 MG/ML IJ SOLN
INTRAMUSCULAR | Status: DC | PRN
  Administered 2014-06-21 (×5): 50 ug via INTRAVENOUS

## 2014-06-21 MED ORDER — FENTANYL CITRATE 0.05 MG/ML IJ SOLN
25.0000 ug | INTRAMUSCULAR | Status: DC | PRN
  Administered 2014-06-21 (×2): 25 ug via INTRAVENOUS
  Administered 2014-06-21: 50 ug via INTRAVENOUS

## 2014-06-21 MED ORDER — LACTATED RINGERS IV SOLN
INTRAVENOUS | Status: DC | PRN
  Administered 2014-06-21 (×2): via INTRAVENOUS

## 2014-06-21 MED ORDER — DEXAMETHASONE SODIUM PHOSPHATE 4 MG/ML IJ SOLN
INTRAMUSCULAR | Status: AC
  Filled 2014-06-21: qty 2

## 2014-06-21 MED ORDER — LIDOCAINE HCL (CARDIAC) 20 MG/ML IV SOLN
INTRAVENOUS | Status: DC | PRN
  Administered 2014-06-21: 40 mg via INTRAVENOUS

## 2014-06-21 MED ORDER — HEPARIN SODIUM (PORCINE) 5000 UNIT/ML IJ SOLN
5000.0000 [IU] | Freq: Three times a day (TID) | INTRAMUSCULAR | Status: DC
  Administered 2014-06-21 – 2014-06-22 (×2): 5000 [IU] via SUBCUTANEOUS
  Filled 2014-06-21 (×3): qty 1

## 2014-06-21 MED ORDER — GLYCOPYRROLATE 0.2 MG/ML IJ SOLN
INTRAMUSCULAR | Status: AC
  Filled 2014-06-21: qty 2

## 2014-06-21 MED ORDER — 0.9 % SODIUM CHLORIDE (POUR BTL) OPTIME
TOPICAL | Status: DC | PRN
  Administered 2014-06-21: 1000 mL

## 2014-06-21 MED ORDER — HYDROMORPHONE HCL 1 MG/ML IJ SOLN
0.5000 mg | INTRAMUSCULAR | Status: DC | PRN
  Administered 2014-06-21: 1 mg via INTRAVENOUS
  Administered 2014-06-21: 2 mg via INTRAVENOUS
  Filled 2014-06-21: qty 2
  Filled 2014-06-21: qty 1

## 2014-06-21 MED ORDER — BUPIVACAINE-EPINEPHRINE 0.25% -1:200000 IJ SOLN
INTRAMUSCULAR | Status: DC | PRN
  Administered 2014-06-21: 10 mL

## 2014-06-21 SURGICAL SUPPLY — 53 items
APL SKNCLS STERI-STRIP NONHPOA (GAUZE/BANDAGES/DRESSINGS) ×1
APPLIER CLIP ROT 10 11.4 M/L (STAPLE) ×3
APR CLP MED LRG 11.4X10 (STAPLE) ×1
BAG SPEC RTRVL LRG 6X4 10 (ENDOMECHANICALS) ×1
BENZOIN TINCTURE PRP APPL 2/3 (GAUZE/BANDAGES/DRESSINGS) ×3 IMPLANT
BLADE SURG ROTATE 9660 (MISCELLANEOUS) ×2 IMPLANT
CANISTER SUCTION 2500CC (MISCELLANEOUS) ×3 IMPLANT
CHLORAPREP W/TINT 26ML (MISCELLANEOUS) ×3 IMPLANT
CLIP APPLIE ROT 10 11.4 M/L (STAPLE) ×1 IMPLANT
CLOSURE WOUND 1/2 X4 (GAUZE/BANDAGES/DRESSINGS) ×1
COVER MAYO STAND STRL (DRAPES) ×3 IMPLANT
COVER SURGICAL LIGHT HANDLE (MISCELLANEOUS) ×3 IMPLANT
DRAPE C-ARM 42X72 X-RAY (DRAPES) ×3 IMPLANT
DRAPE LAPAROSCOPIC ABDOMINAL (DRAPES) ×3 IMPLANT
DRSG TEGADERM 2-3/8X2-3/4 SM (GAUZE/BANDAGES/DRESSINGS) ×9 IMPLANT
DRSG TEGADERM 4X4.75 (GAUZE/BANDAGES/DRESSINGS) ×3 IMPLANT
ELECT REM PT RETURN 9FT ADLT (ELECTROSURGICAL) ×3
ELECTRODE REM PT RTRN 9FT ADLT (ELECTROSURGICAL) ×1 IMPLANT
FILTER SMOKE EVAC LAPAROSHD (FILTER) ×3 IMPLANT
GAUZE SPONGE 2X2 8PLY STRL LF (GAUZE/BANDAGES/DRESSINGS) ×1 IMPLANT
GLOVE BIO SURGEON STRL SZ 6 (GLOVE) ×2 IMPLANT
GLOVE BIO SURGEON STRL SZ 6.5 (GLOVE) ×2 IMPLANT
GLOVE BIO SURGEON STRL SZ7 (GLOVE) ×3 IMPLANT
GLOVE BIO SURGEONS STRL SZ 6.5 (GLOVE) ×2
GLOVE BIOGEL PI IND STRL 6.5 (GLOVE) IMPLANT
GLOVE BIOGEL PI IND STRL 7.0 (GLOVE) IMPLANT
GLOVE BIOGEL PI IND STRL 7.5 (GLOVE) ×1 IMPLANT
GLOVE BIOGEL PI INDICATOR 6.5 (GLOVE) ×2
GLOVE BIOGEL PI INDICATOR 7.0 (GLOVE) ×6
GLOVE BIOGEL PI INDICATOR 7.5 (GLOVE) ×2
GOWN STRL REUS W/ TWL LRG LVL3 (GOWN DISPOSABLE) ×3 IMPLANT
GOWN STRL REUS W/TWL LRG LVL3 (GOWN DISPOSABLE) ×9
HEMOSTAT SNOW SURGICEL 2X4 (HEMOSTASIS) ×4 IMPLANT
KIT BASIN OR (CUSTOM PROCEDURE TRAY) ×3 IMPLANT
KIT ROOM TURNOVER OR (KITS) ×3 IMPLANT
NS IRRIG 1000ML POUR BTL (IV SOLUTION) ×3 IMPLANT
PAD ARMBOARD 7.5X6 YLW CONV (MISCELLANEOUS) ×3 IMPLANT
POUCH SPECIMEN RETRIEVAL 10MM (ENDOMECHANICALS) ×3 IMPLANT
SCISSORS LAP 5X35 DISP (ENDOMECHANICALS) ×3 IMPLANT
SET CHOLANGIOGRAPH 5 50 .035 (SET/KITS/TRAYS/PACK) ×3 IMPLANT
SET IRRIG TUBING LAPAROSCOPIC (IRRIGATION / IRRIGATOR) ×3 IMPLANT
SLEEVE ENDOPATH XCEL 5M (ENDOMECHANICALS) ×3 IMPLANT
SPECIMEN JAR SMALL (MISCELLANEOUS) ×3 IMPLANT
SPONGE GAUZE 2X2 STER 10/PKG (GAUZE/BANDAGES/DRESSINGS) ×2
STRIP CLOSURE SKIN 1/2X4 (GAUZE/BANDAGES/DRESSINGS) ×1 IMPLANT
SUT MNCRL AB 4-0 PS2 18 (SUTURE) ×3 IMPLANT
TOWEL OR 17X24 6PK STRL BLUE (TOWEL DISPOSABLE) ×3 IMPLANT
TOWEL OR 17X26 10 PK STRL BLUE (TOWEL DISPOSABLE) ×3 IMPLANT
TRAY LAPAROSCOPIC (CUSTOM PROCEDURE TRAY) ×3 IMPLANT
TROCAR XCEL BLUNT TIP 100MML (ENDOMECHANICALS) ×3 IMPLANT
TROCAR XCEL NON-BLD 11X100MML (ENDOMECHANICALS) ×3 IMPLANT
TROCAR XCEL NON-BLD 5MMX100MML (ENDOMECHANICALS) ×3 IMPLANT
TUBING INSUFFLATION (TUBING) ×3 IMPLANT

## 2014-06-21 NOTE — Progress Notes (Signed)
Patient ID: Arthur PiggsHugh N Duncan, male   DOB: 09/26/1955, 59 y.o.   MRN: [id number] Day of Surgery  Subjective: Pt and family are somewhat upset about some things that happened in the ED and generally frustrated with delayed diagnosis for the last 2 weeks.  Patient's father also just passed away 3 weeks ago.  C/o a lot of pain  Objective: Vital signs in last 24 hours: Temp:  [97.9 F (36.6 C)-101.2 F (38.4 C)] 101.2 F (38.4 C) (02/20 1051) Pulse Rate:  [62-80] 79 (02/20 0524) Resp:  [11-19] 12 (02/20 0524) BP: (112-147)/(66-113) 131/70 mmHg (02/20 0524) SpO2:  [91 %-98 %] 94 % (02/20 0524) Weight:  [177 lb 4 oz (80.4 kg)] 177 lb 4 oz (80.4 kg) (02/20 0019) Last BM Date: 06/15/14  Intake/Output from previous day:   Intake/Output this shift:    PE: Abd: soft, tender in RUQ, +BS Heart: regular Lungs: CTAB  Lab Results:   Recent Labs  06/20/14 1614  WBC 14.3*  HGB 14.0  HCT 40.7  PLT 325   BMET  Recent Labs  06/20/14 1614 06/21/14 0539  NA 133* 136  K 4.0 3.8  CL 95* 100  CO2 27 30  GLUCOSE 179* 106*  BUN 8 8  CREATININE 0.98 0.99  CALCIUM 8.7 8.7   PT/INR No results for input(s): LABPROT, INR in the last 72 hours. CMP     Component Value Date/Time   NA 136 06/21/2014 0539   K 3.8 06/21/2014 0539   CL 100 06/21/2014 0539   CO2 30 06/21/2014 0539   GLUCOSE 106* 06/21/2014 0539   BUN 8 06/21/2014 0539   CREATININE 0.99 06/21/2014 0539   CALCIUM 8.7 06/21/2014 0539   PROT 6.8 06/21/2014 0539   ALBUMIN 2.7* 06/21/2014 0539   AST 111* 06/21/2014 0539   ALT 119* 06/21/2014 0539   ALKPHOS 248* 06/21/2014 0539   BILITOT 1.2 06/21/2014 0539   GFRNONAA 88* 06/21/2014 0539   GFRAA >90 06/21/2014 0539   Lipase     Component Value Date/Time   LIPASE 24 06/20/2014 1614       Studies/Results: [place] Abdomen Complete  06/20/2014   CLINICAL DATA:  Right upper quadrant pain.  EXAM: ULTRASOUND ABDOMEN COMPLETE  COMPARISON:  CT abdomen and pelvis, 06/16/2014   FINDINGS: Gallbladder: There is dependent sludge but no shadowing stones. The wall is thickened to 5 mm with wall edema. Patient is tender to transducer pressure over the gallbladder.  Common bile duct: Diameter: 4.7 mm.  No evidence of a duct stone.  Liver: No focal lesion identified. Within normal limits in parenchymal echogenicity.  IVC: No abnormality visualized.  Pancreas: Limited visualization due to overlying bowel gas. Portions of the pancreas visualized are unremarkable.  Spleen: Size and appearance within normal limits.  Right Kidney: Length: 12.7 cm. Echogenicity within normal limits. No mass or hydronephrosis visualized.  Left Kidney: Length: 12.3 cm. Echogenicity within normal limits. No mass or hydronephrosis visualized.  Abdominal aorta: No aneurysm visualized.  Other findings: None.  IMPRESSION: 1. Gallbladder sludge, gallbladder wall thickening/edema and a positive sonographic Murphy's sign. Findings support acute cholecystitis despite the lack of visualization of a shadowing stone. 2. No other acute findings.  No other abnormalities.   Electronically Signed   By: Amie Portlandavid  Ormond M.D.   On: 06/20/2014 21:05    Anti-infectives: Anti-infectives    Start     Dose/Rate Route Frequency Ordered Stop   06/21/14 0330  [MAR Hold]  piperacillin-tazobactam (ZOSYN) IVPB 3.375 g     (  MAR Hold since 06/21/14 1135)   3.375 g 12.5 mL/hr over 240 Minutes Intravenous Every 8 hours 06/20/14 2344     06/20/14 2130  piperacillin-tazobactam (ZOSYN) IVPB 3.375 g     3.375 g 100 mL/hr over 30 Minutes Intravenous  Once 06/20/14 2119 06/20/14 2208       Assessment/Plan  1. Acute cholecystitis 2. Gout  Plan: 1. Consent was obtained while i was in the room.  The procedure including risks, complications, and expected outcome were d/w the patient, wife, and daughter.  They all understand and agree to proceed.  All questions answered. 2. Cont zosyn 3. To OR today for cholecystectomy.   LOS: 1 day     Birl Lobello E 06/21/2014, 11:40 AM Pager: [phone]

## 2014-06-21 NOTE — Progress Notes (Signed)
NURSING PROGRESS NOTE  Arthur Duncan [place] [id number] Admission Data: 06/21/2014 6:32 AM Attending Provider: Bishop LimboMd Ccs, MD WUJ:[id number],GNFAPCP:MITCHELL,DEAN, MD Code Status: Full  Arthur Duncan Arthur Duncan is a 59 y.o. male patient admitted from ED:  -No acute distress noted.  -No complaints of shortness of breath.  -No complaints of chest pain.   Cardiac Monitoring: Duncan/A   Blood pressure 131/70, pulse 79, temperature 100.8 F (38.2 C), temperature source Oral, resp. rate 12, height 5\' 8"  (1.727 m), weight 80.4 kg (177 lb 4 oz), SpO2 94 %.   IV Fluids:  IV in place, occlusive dsg intact without redness, IV cath forearm left, condition patent and no redness none.   Allergies:  Review of patient's allergies indicates no known allergies.  Past Medical History:   has a past medical history of Gout and Hypercholesteremia.  Past Surgical History:   has past surgical history that includes Appendectomy.  Social History:   reports that he has never smoked. He does not have any smokeless tobacco history on file. He reports that he drinks about 1.2 oz of alcohol per week. He reports that he does not use illicit drugs.  Skin: intact  Patient/Family orientated to room. Information packet given to patient/family. Admission inpatient armband information verified with patient/family to include name and date of birth and placed on patient arm. Side rails up x 2, fall assessment and education completed with patient/family. Patient/family able to verbalize understanding of risk associated with falls and verbalized understanding to call for assistance before getting out of bed. Call light within reach. Patient/family able to voice and demonstrate understanding of unit orientation instructions.

## 2014-06-21 NOTE — Anesthesia Preprocedure Evaluation (Signed)
Anesthesia Evaluation  Patient identified by MRN, date of birth, ID band  Reviewed: Allergy & Precautions, NPO status , Patient's Chart, lab work & pertinent test results  Airway Mallampati: II  TM Distance: >3 FB Neck ROM: Full    Dental  (+) Teeth Intact, Dental Advisory Given   Pulmonary  breath sounds clear to auscultation        Cardiovascular Rhythm:Regular Rate:Normal     Neuro/Psych    GI/Hepatic   Endo/Other    Renal/GU      Musculoskeletal   Abdominal   Peds  Hematology   Anesthesia Other Findings   Reproductive/Obstetrics                             Anesthesia Physical Anesthesia Plan  ASA: II  Anesthesia Plan: General   Post-op Pain Management:    Induction: Intravenous  Airway Management Planned: Oral ETT  Additional Equipment:   Intra-op Plan:   Post-operative Plan:   Informed Consent: I have reviewed the patients History and Physical, chart, labs and discussed the procedure including the risks, benefits and alternatives for the proposed anesthesia with the patient or authorized representative who has indicated his/her understanding and acceptance.   Dental advisory given  Plan Discussed with: CRNA and Anesthesiologist  Anesthesia Plan Comments:         Anesthesia Quick Evaluation

## 2014-06-21 NOTE — Op Note (Signed)
Laparoscopic Cholecystectomy with IOC Procedure Note  Indications: This patient presents with symptomatic gallbladder disease and will undergo laparoscopic cholecystectomy.  Pre-operative Diagnosis: Calculus of gallbladder with acute cholecystitis, without mention of obstruction  Post-operative Diagnosis: Acute gangrenous cholecystitis  Surgeon: Felicie Kocher K.   Assistants: Dr. Almond Lint  Anesthesia: General endotracheal anesthesia  ASA Class: 2E  Procedure Details  The patient was seen again in the Holding Room. The risks, benefits, complications, treatment options, and expected outcomes were discussed with the patient. The possibilities of reaction to medication, pulmonary aspiration, perforation of viscus, bleeding, recurrent infection, finding a normal gallbladder, the need for additional procedures, failure to diagnose a condition, the possible need to convert to an open procedure, and creating a complication requiring transfusion or operation were discussed with the patient. The likelihood of improving the patient's symptoms with return to their baseline status is good.  The patient and/or family concurred with the proposed plan, giving informed consent. The site of surgery properly noted. The patient was taken to Operating Room, identified as Mart Piggs and the procedure verified as Laparoscopic Cholecystectomy with Intraoperative Cholangiogram. A Time Out was held and the above information confirmed.  Prior to the induction of general anesthesia, antibiotic prophylaxis was administered. General endotracheal anesthesia was then administered and tolerated well. After the induction, the abdomen was prepped with Chloraprep and draped in the sterile fashion. The patient was positioned in the supine position.  Local anesthetic agent was injected into the skin near the umbilicus and an incision made.  He has a very small umbilical hernia that we used for our fascial opening.  This was  enlarged slightly.  A pursestring suture of 0-Vicryl was placed around the fascial opening.  The Hasson cannula was inserted and secured with the stay suture.  Pneumoperitoneum was then created with CO2 and tolerated well without any adverse changes in the patient's vital signs. An 11-mm port was placed in the subxiphoid position.  Two 5-mm ports were placed in the right upper quadrant. All skin incisions were infiltrated with a local anesthetic agent before making the incision and placing the trocars.   We positioned the patient in reverse Trendelenburg, tilted slightly to the patient's left.  A large amount of omentum was adherent to the area of the gallbladder, as well as the anterior abdominal wall.  Blunt dissection was used to dissect the omentum away from the abdominal wall and the gallbladder.  The gallbladder is very thickened and erythematous with large gangrenous patches on the fundus.  The gallbladder was decompressed with the suction irrigator. The infundibulum was grasped and retracted laterally, exposing the peritoneum overlying the triangle of Calot. This was then divided and exposed in a blunt fashion. A critical view of the cystic duct and cystic artery was obtained.  The cystic duct was clearly identified and bluntly dissected circumferentially. The cystic duct was ligated with a clip distally.   An incision was made in the cystic duct and the [facility] cholangiogram catheter introduced. The catheter was secured using a clip. A cholangiogram was then obtained which showed good visualization of the distal and proximal biliary tree with no sign of filling defects or obstruction.  Contrast flowed easily into the duodenum. The catheter was then removed.   The cystic duct was then ligated with clips and divided. The cystic artery was identified, dissected free, ligated with clips and divided as well.   The gallbladder was dissected from the liver bed in retrograde fashion with the electrocautery.  There  was significant oozing with the inflamed nature of the gallbladder.  The gallbladder was removed and placed in an Endocatch sac. The liver bed was irrigated and inspected. Hemostasis was achieved with the electrocautery and Surgicel SNOW. Copious irrigation was utilized and was repeatedly aspirated until clear.  The gallbladder and Endocatch sac were then removed through the umbilical port site.  The pursestring suture was used to close the umbilical fascia.    We again inspected the right upper quadrant for hemostasis.  Pneumoperitoneum was released as we removed the trocars.  4-0 Monocryl was used to close the skin.   Benzoin, steri-strips, and clean dressings were applied. The patient was then extubated and brought to the recovery room in stable condition. Instrument, sponge, and needle counts were correct at closure and at the conclusion of the case.   Findings: Gangrenous cholecystitis with Cholelithiasis  Estimated Blood Loss: less than 100 mL         Drains: none         Specimens: Gallbladder           Complications: None; patient tolerated the procedure well.         Disposition: PACU - hemodynamically stable.         Condition: stable   Wilmon ArmsMatthew K. Corliss Skainssuei, MD, [facility]FACS [facility]  General/ Trauma Surgery  06/21/2014 3:54 PM

## 2014-06-21 NOTE — Anesthesia Postprocedure Evaluation (Signed)
  Anesthesia Post-op Note  Patient: Arthur Duncan  Procedure(s) Performed: Procedure(s): LAPAROSCOPIC CHOLECYSTECTOMY WITH INTRAOPERATIVE CHOLANGIOGRAM (N/A)  Patient Location: PACU  Anesthesia Type:General  Level of Consciousness: awake, alert  and oriented  Airway and Oxygen Therapy: Patient Spontanous Breathing and Patient connected to nasal cannula oxygen  Post-op Pain: mild  Post-op Assessment: Post-op Vital signs reviewed, Patient's Cardiovascular Status Stable, Respiratory Function Stable, Patent Airway, No signs of Nausea or vomiting and Pain level controlled  Post-op Vital Signs: stable  Last Vitals:  Filed Vitals:   06/21/14 1645  BP: 149/94  Pulse: 81  Temp: 36.4 C  Resp: 18    Complications: No apparent anesthesia complications

## 2014-06-21 NOTE — Transfer of Care (Signed)
Immediate Anesthesia Transfer of Care Note  Patient: Arthur PiggsHugh N Duncan  Procedure(s) Performed: Procedure(s): LAPAROSCOPIC CHOLECYSTECTOMY WITH INTRAOPERATIVE CHOLANGIOGRAM (N/A)  Patient Location: PACU  Anesthesia Type:General  Level of Consciousness: awake  Airway & Oxygen Therapy: Patient Spontanous Breathing  Post-op Assessment: Report given to RN  Post vital signs: Reviewed and stable  Last Vitals:  Filed Vitals:   06/21/14 1051  BP:   Pulse:   Temp: 38.4 C  Resp:     Complications: No apparent anesthesia complications

## 2014-06-22 LAB — COMPREHENSIVE METABOLIC PANEL
ALT: 102 U/L — ABNORMAL HIGH (ref 0–53)
ANION GAP: 7 (ref 5–15)
AST: 70 U/L — ABNORMAL HIGH (ref 0–37)
Albumin: 2.6 g/dL — ABNORMAL LOW (ref 3.5–5.2)
Alkaline Phosphatase: 235 U/L — ABNORMAL HIGH (ref 39–117)
BILIRUBIN TOTAL: 0.6 mg/dL (ref 0.3–1.2)
BUN: 10 mg/dL (ref 6–23)
CHLORIDE: 99 mmol/L (ref 96–112)
CO2: 29 mmol/L (ref 19–32)
Calcium: 8.7 mg/dL (ref 8.4–10.5)
Creatinine, Ser: 0.95 mg/dL (ref 0.50–1.35)
GFR calc Af Amer: >90 mL/min (ref >90)
Glucose, Bld: 149 mg/dL — ABNORMAL HIGH (ref 70–99)
POTASSIUM: 4.1 mmol/L (ref 3.5–5.1)
Sodium: 135 mmol/L (ref 135–145)
Total Protein: 6.5 g/dL (ref 6.0–8.3)

## 2014-06-22 LAB — CBC
HCT: 36.3 % — ABNORMAL LOW (ref 39.0–52.0)
HEMOGLOBIN: 12.4 g/dL — AB (ref 13.0–17.0)
MCH: 29.5 pg (ref 26.0–34.0)
MCHC: 34.2 g/dL (ref 30.0–36.0)
MCV: 86.4 fL (ref 78.0–100.0)
Platelets: 323 10*3/uL (ref 150–400)
RBC: 4.2 MIL/uL — AB (ref 4.22–5.81)
RDW: 12.1 % (ref 11.5–15.5)
WBC: 16.4 10*3/uL — ABNORMAL HIGH (ref 4.0–10.5)

## 2014-06-22 MED ORDER — IBUPROFEN 600 MG PO TABS: 600.0000 mg | ORAL_TABLET | Freq: Four times a day (QID) | ORAL | Status: DC | PRN

## 2014-06-22 MED ORDER — ALPRAZOLAM 0.25 MG PO TABS
0.2500 mg | ORAL_TABLET | Freq: Once | ORAL | Status: AC
  Administered 2014-06-22: 0.25 mg via ORAL
  Filled 2014-06-22: qty 1

## 2014-06-22 MED ORDER — OXYCODONE-ACETAMINOPHEN 5-325 MG PO TABS: 1.0000 | ORAL_TABLET | ORAL | Status: AC | PRN

## 2014-06-22 MED ORDER — IBUPROFEN 200 MG PO TABS: 400.0000 mg | ORAL_TABLET | Freq: Four times a day (QID) | ORAL | Status: AC | PRN

## 2014-06-22 MED ORDER — AMOXICILLIN-POT CLAVULANATE 875-125 MG PO TABS
1.0000 | ORAL_TABLET | Freq: Two times a day (BID) | ORAL | Status: DC
  Administered 2014-06-22: 1 via ORAL
  Filled 2014-06-22 (×2): qty 1

## 2014-06-22 MED ORDER — AMOXICILLIN-POT CLAVULANATE 875-125 MG PO TABS: 1.0000 | ORAL_TABLET | Freq: Two times a day (BID) | ORAL | Status: AC

## 2014-06-22 MED ORDER — KETOROLAC TROMETHAMINE 30 MG/ML IJ SOLN
30.0000 mg | Freq: Once | INTRAMUSCULAR | Status: AC
  Administered 2014-06-22: 30 mg via INTRAVENOUS
  Filled 2014-06-22: qty 1

## 2014-06-22 NOTE — Progress Notes (Signed)
[name] Pasqual to be D/C'd Home per MD order.  Discussed with the patient and all questions fully answered.    Medication List    STOP taking these medications        HYDROcodone-acetaminophen 5-325 MG per tablet  Commonly known as:  NORCO/VICODIN      TAKE these medications        allopurinol 100 MG tablet  Commonly known as:  ZYLOPRIM  Take 100 mg by mouth daily.     amoxicillin-clavulanate 875-125 MG per tablet  Commonly known as:  AUGMENTIN  Take 1 tablet by mouth every 12 (twelve) hours.     ciprofloxacin 500 MG tablet  Commonly known as:  CIPRO  Take 500 mg by mouth 2 (two) times daily. Started 06/19/14, for 10 days ending 06/28/14     dicyclomine 20 MG tablet  Commonly known as:  BENTYL  Take 1 tablet (20 mg total) by mouth every 12 (twelve) hours as needed (abd cramping).     ibuprofen 200 MG tablet  Commonly known as:  ADVIL,MOTRIN  Take 2-3 tablets (400-600 mg total) by mouth every 6 (six) hours as needed for headache, mild pain or moderate pain.     ondansetron 8 MG disintegrating tablet  Commonly known as:  ZOFRAN ODT  8mg  ODT q4 hours prn nausea     oxyCODONE-acetaminophen 5-325 MG per tablet  Commonly known as:  PERCOCET/ROXICET  Take 1-2 tablets by mouth every 4 (four) hours as needed for moderate pain.        VVS, Skin clean, dry and intact without evidence of skin break down, no evidence of skin tears noted. IV catheter discontinued intact. Site without signs and symptoms of complications. Dressing and pressure applied.  An After Visit Summary was printed and given to the patient.  D/c education completed with patient/family including follow up instructions, medication list, d/c activities limitations if indicated, with other d/c instructions as indicated by MD - patient able to verbalize understanding, all questions fully answered.   Patient instructed to return to ED, call 911, or call MD for any changes in condition.   Patient escorted via WC, and  D/C home via private auto.  Ruthy DickBennett, Jamie Hafford A 06/22/2014 11:37 AM

## 2014-06-22 NOTE — Discharge Summary (Signed)
Physician Discharge Summary  Patient ID: COBI DELPH MRN: [id number] DOB/AGE: Feb 14, 1956 59 y.o.  Admit date: 06/20/2014 Discharge date: 06/22/2014  Admission Diagnoses: Patient Active Problem List   Diagnosis Date Noted  . Cholecystitis 06/20/2014    Discharge Diagnoses:  Active Problems:   Cholecystitis   Discharged Condition: stable  Hospital Course:  Pt was admitted to the hospital with cholecystitis on 2/19 pm.  He was taken to the operating room the following day for lap chole with IOC.  His cholangiogram was normal.  He did have a gangrenous gallbladder, but no abscess.  He was doing well the next day with his previous pain gone, but with pain at his incisions and surgical site.  He did not want to take strong narcotics, so he was given a dose of toradol.  He also has significant anxiety and was mostly using aggressive exercise to manage.  He was given a one time low dose xanax to assist with managing this, but was not given a script for this.  He was ambulatory and voiding spontaneously.  He was able to eat and do incentive spirometry and was discharged to home in stable condition.  .    Consults: None  Significant Diagnostic Studies: labs: LFTs improving and cholangiogram normal.    Treatments: antibiotics: Zosyn and surgery: lap chole with IOC  Discharge Exam: Blood pressure 121/72, pulse 61, temperature 97.5 F (36.4 C), temperature source Oral, resp. rate 18, height  (1.727 m), weight 177 lb 4 oz (80.4 kg), SpO2 95 %. General appearance: alert, cooperative and no distress Resp: breathing comfortably GI: soft, approp tender at incisions.  mildly bloated.    Disposition: 01-Home or Self Care      Discharge Instructions    Call MD for:  difficulty breathing, headache or visual disturbances    Complete by:  As directed      Call MD for:  hives    Complete by:  As directed      Call MD for:  persistant nausea and vomiting    Complete by:  As directed      Call MD for:  redness, tenderness, or signs of infection (pain, swelling, redness, odor or green/yellow discharge around incision site)    Complete by:  As directed      Call MD for:  severe uncontrolled pain    Complete by:  As directed      Call MD for:  temperature >100.4    Complete by:  As directed      Diet - low sodium heart healthy    Complete by:  As directed      Discharge instructions    Complete by:  As directed   Do incentive spirometry at home at least 4-6 instances per day with at least 10 inhalations per instance.     Increase activity slowly    Complete by:  As directed             Medication List    STOP taking these medications        HYDROcodone-acetaminophen 5-325 MG per tablet  Commonly known as:  NORCO/VICODIN      TAKE these medications        allopurinol 100 MG tablet  Commonly known as:  ZYLOPRIM  Take 100 mg by mouth daily.     amoxicillin-clavulanate 875-125 MG per tablet  Commonly known as:  AUGMENTIN  Take 1 tablet by mouth every 12 (twelve) hours.     ciprofloxacin  500 MG tablet  Commonly known as:  CIPRO  Take 500 mg by mouth 2 (two) times daily. Started 06/19/14, for 10 days ending 06/28/14     dicyclomine 20 MG tablet  Commonly known as:  BENTYL  Take 1 tablet (20 mg total) by mouth every 12 (twelve) hours as needed (abd cramping).     ibuprofen 200 MG tablet  Commonly known as:  ADVIL,MOTRIN  Take 2-3 tablets (400-600 mg total) by mouth every 6 (six) hours as needed for headache, mild pain or moderate pain.     ondansetron 8 MG disintegrating tablet  Commonly known as:  ZOFRAN ODT  8mg  ODT q4 hours prn nausea     oxyCODONE-acetaminophen 5-325 MG per tablet  Commonly known as:  PERCOCET/ROXICET  Take 1-2 tablets by mouth every 4 (four) hours as needed for moderate pain.         SignedAlmond Lint: Cire Clute 06/22/2014, 9:04 AM

## 2014-06-22 NOTE — Discharge Instructions (Addendum)
Laparoscopic Cholecystectomy, Care After Refer to this sheet in the next few weeks. These instructions provide you with information on caring for yourself after your procedure. Your health care provider may also give you more specific instructions. Your treatment has been planned according to current medical practices, but problems sometimes occur. Call your health care provider if you have any problems or questions after your procedure. WHAT TO EXPECT AFTER THE PROCEDURE After your procedure, it is typical to have the following:  Pain at your incision sites. You will be given pain medicines to control the pain.  Mild nausea or vomiting. This should improve after the first 24 hours.  Bloating and possibly shoulder pain from the gas used during the procedure. This will improve after the first 24 hours. HOME CARE INSTRUCTIONS   Change bandages (dressings) as directed by your health care provider.  Keep the wound dry and clean. You may wash the wound gently with soap and water. Gently blot or dab the area dry.  You may shower in 48 hours after surgery.  Do not take baths or use swimming pools or hot tubs for 2 weeks or until your health care provider approves.  You may take ibuprofen (motrin/advil) up to 600 mg 4x per day in addition to or in place of narcotic pain medication  DO NOT take acetaminophen (tylenol) while on pain medication because the narcotic pain medication has acetaminophen in it.  Continue your normal diet as directed by your health care provider.  Do not lift anything heavier than 10 pounds (4.5 kg) until your health care provider approves.  Do not play contact sports for 2 weeks or until your health care provider approves. SEEK MEDICAL CARE IF:  1. You have redness, swelling, or increasing pain in the wound. 2. You notice yellowish-white fluid (pus) coming from the wound. 3. You have drainage from the wound that lasts longer than 1 day. 4. You notice a bad smell coming  from the wound or dressing. 5. Your surgical cuts (incisions) break open. SEEK IMMEDIATE MEDICAL CARE IF:   You turn yellow or have coca-cola colored urine  You develop a rash.  You have difficulty breathing.  You have chest pain.  You have a fever.  You have increasing pain in the shoulders (shoulder strap areas).  You have dizzy episodes or faint while standing.  You have severe abdominal pain.  You feel sick to your stomach (nauseous) or throw up (vomit) and this lasts for more than 1 day.   Incentive Spirometer An incentive spirometer is a tool that can help keep your lungs clear and active. This tool measures how well you are filling your lungs with each breath. Taking long, deep breaths may help reverse or decrease the chance of developing breathing (pulmonary) problems (especially infection) following:  Surgery of the chest or abdomen.  Surgery if you have a history of smoking or a lung problem.  A long period of time when you are unable to move or be active. BEFORE THE PROCEDURE   If the spirometer includes an indicator to show your best effort, your nurse or respiratory therapist will set it to a desired goal.  If possible, sit up straight or lean slightly forward. Try not to slouch.  Hold the incentive spirometer in an upright position. INSTRUCTIONS FOR USE  6. Sit on the edge of your bed if possible, or sit up as far as you can in bed or on a chair. 7. Hold the incentive spirometer in  an upright position. 8. Breathe out normally. 9. Place the mouthpiece in your mouth and seal your lips tightly around it. 10. Breathe in slowly and as deeply as possible, raising the piston or the ball toward the top of the column. 11. Hold your breath for 3-5 seconds or for as long as possible. Allow the piston or ball to fall to the bottom of the column. 12. Remove the mouthpiece from your mouth and breathe out normally. 13. Rest for a few seconds and repeat Steps 1 through 7  at least 10 times every 1-2 hours when you are awake. Take your time and take a few normal breaths between deep breaths. 14. The spirometer may include an indicator to show your best effort. Use the indicator as a goal to work toward during each repetition. 15. After each set of 10 deep breaths, practice coughing to be sure your lungs are clear. If you have an incision (the cut made at the time of surgery), support your incision when coughing by placing a pillow or rolled-up towels firmly against it. Once you are able to get out of bed, walk around indoors and cough well. You may stop using the incentive spirometer when instructed by your caregiver.

## 2014-06-23 ENCOUNTER — Encounter (HOSPITAL_COMMUNITY): Payer: Self-pay | Admitting: Surgery

## 2016-03-25 IMAGING — CT CT ABD-PELV W/ CM
2 of 5 series · 16 of 46 positions shown, 18 images · IV contrast (APPLIED)
Comparison: None.

CLINICAL DATA: Upper to mid abdominal pain with emesis and diarrhea
for 1 day

EXAM:
CT ABDOMEN AND PELVIS WITH CONTRAST
TECHNIQUE: Multidetector CT imaging of the abdomen and pelvis was performed
using the standard protocol following bolus administration of
intravenous contrast. Oral contrast was also administered.
CONTRAST:  100mL OMNIPAQUE IOHEXOL 300 MG/ML  SOLN

[Series 2: abd/ pelvis 5.0 i30f 1 · axial · 0.80mm/px · z∈[+763,+1228]mm · 13 of 105 slices shown, 15 images]
[im 6/105  soft-tissue]
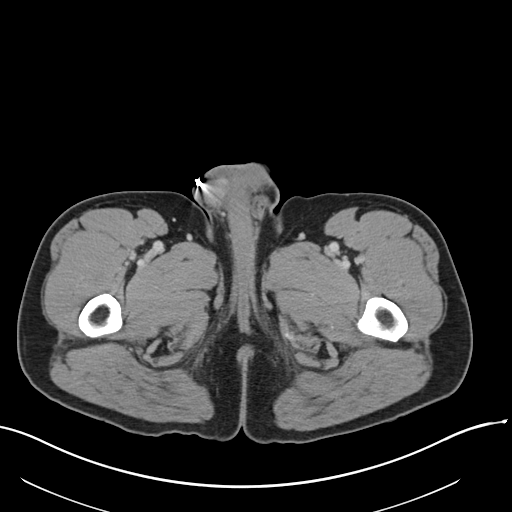
[im 6/105  bone]
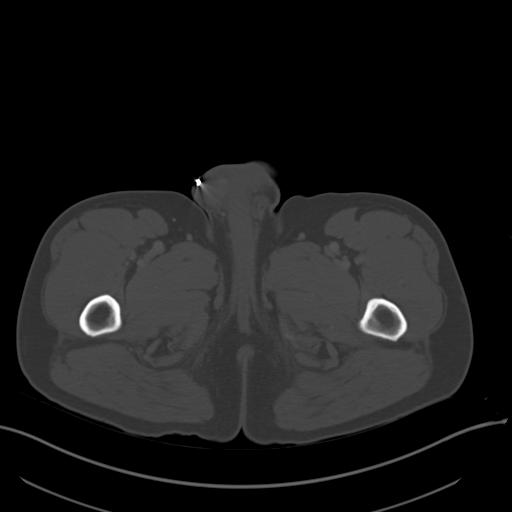
[im 12/105  soft-tissue]
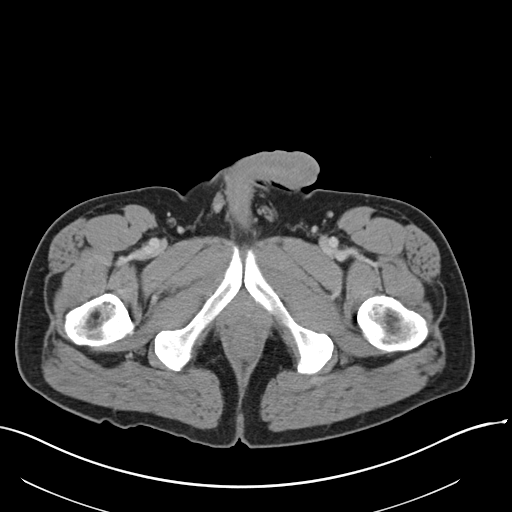
[im 24/105  soft-tissue]
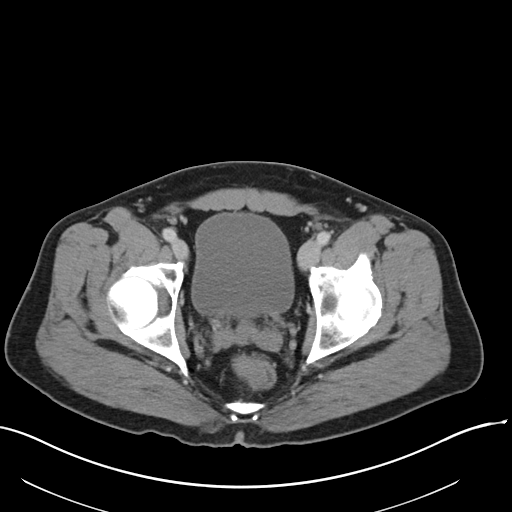
[im 29/105  soft-tissue]
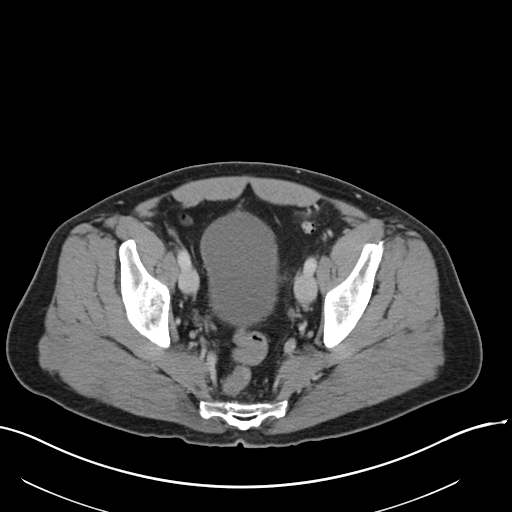
[im 35/105  soft-tissue]
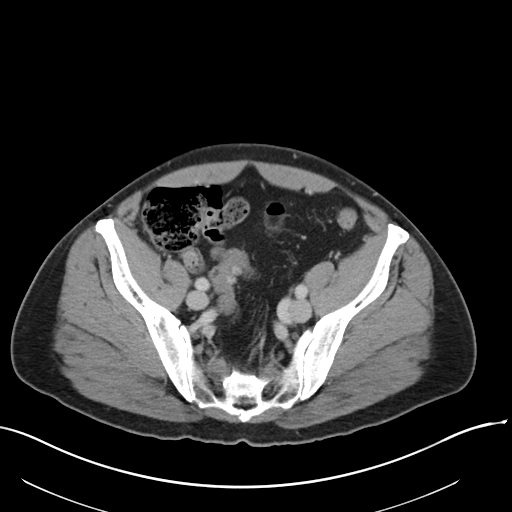
[im 47/105  soft-tissue]
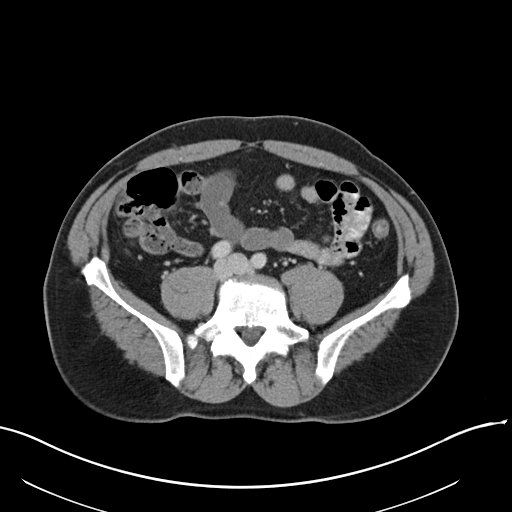
[im 53/105  soft-tissue]
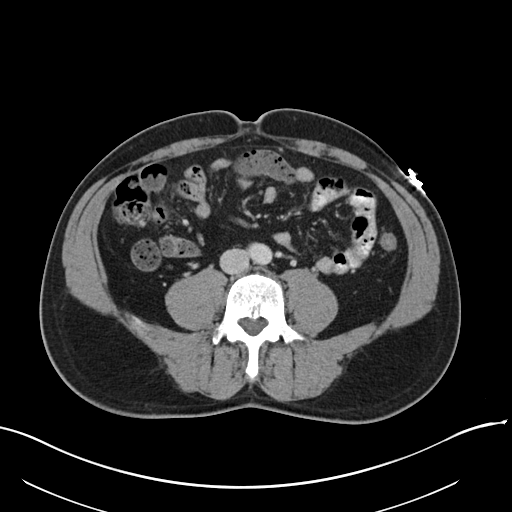
[im 58/105  soft-tissue]
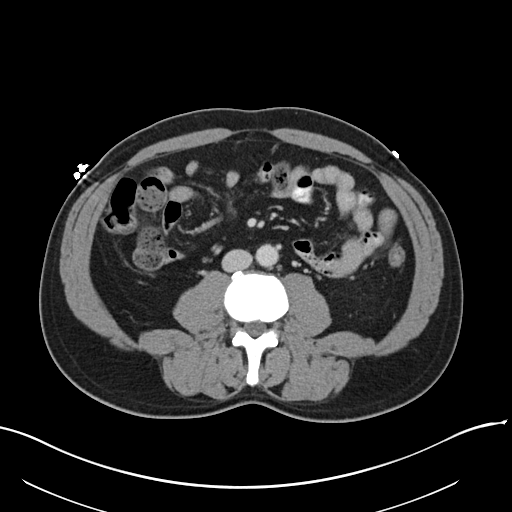
[im 70/105  soft-tissue]
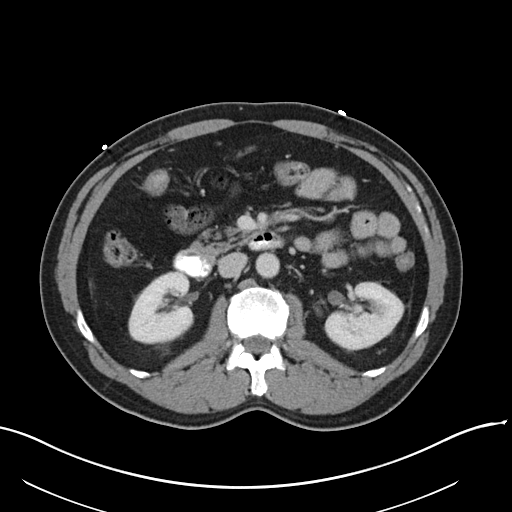
[im 70/105  bone]
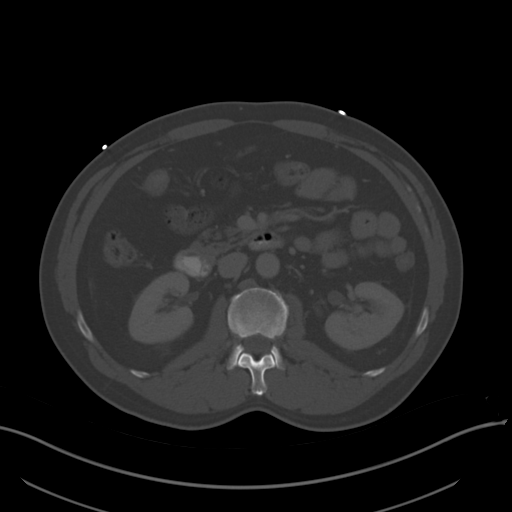
[im 76/105  soft-tissue]
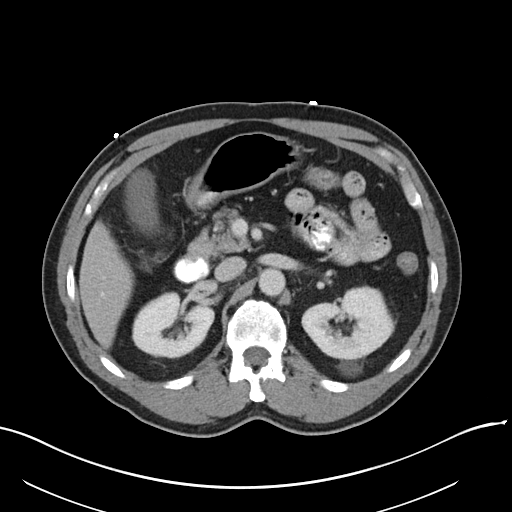
[im 81/105  soft-tissue]
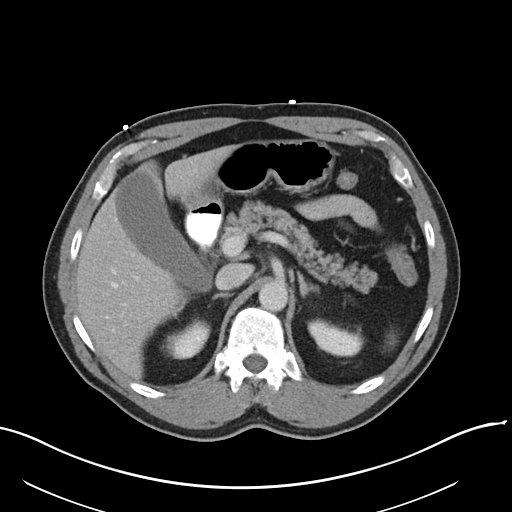
[im 93/105  soft-tissue]
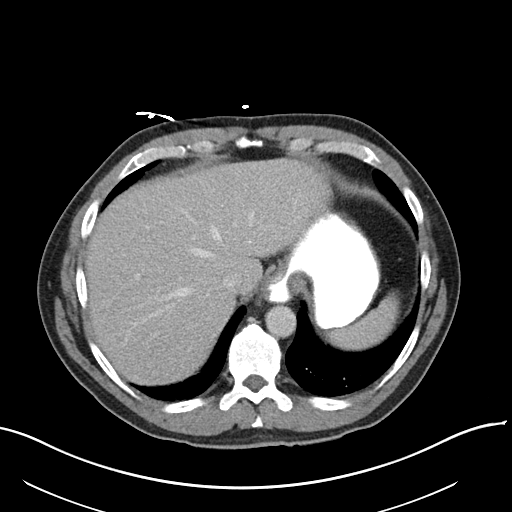
[im 99/105  soft-tissue]
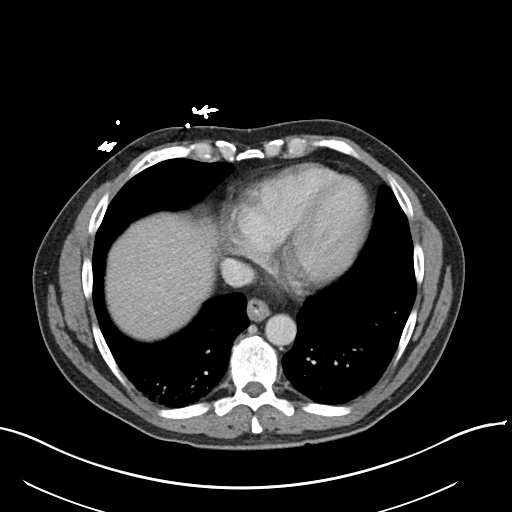

[Series 5: coronal soft tissue · coronal · 0.79mm/px · 3 of 90 slices shown]
[im 30/90  soft-tissue]
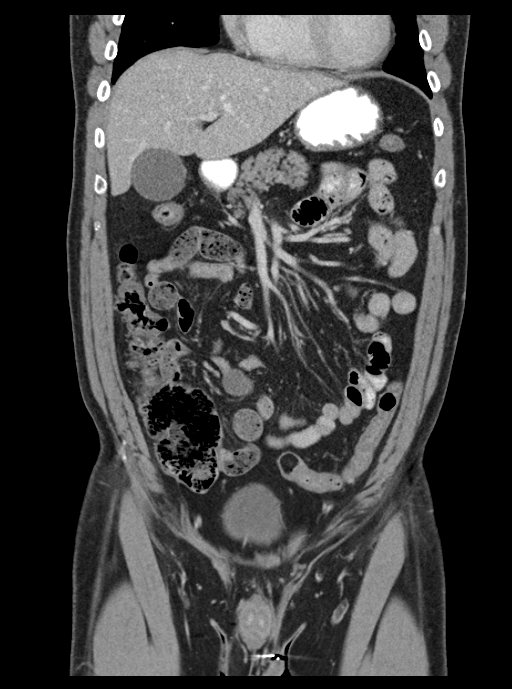
[im 40/90  soft-tissue]
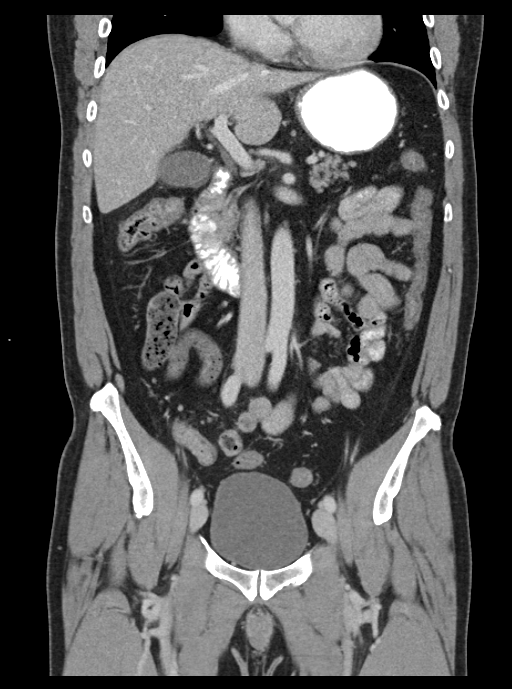
[im 50/90  soft-tissue]
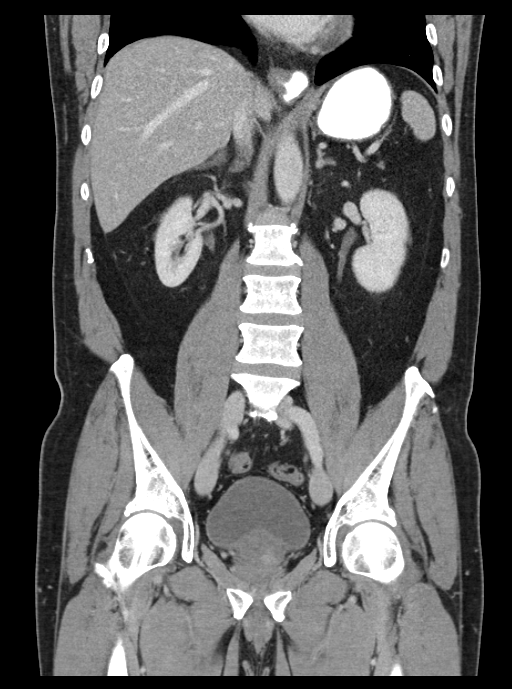

[16 of 46 positions shown; findings below may reference images not displayed]

FINDINGS: There is mild bibasilar lung atelectatic change. There is a small
hiatal hernia. Oral contrast in this area suggests its air may be a
degree of gastroesophageal reflux.

No focal liver lesions are identified. Gallbladder is mildly
distended. There is no appreciable gallbladder wall thickening.
There is no biliary duct dilatation.

Spleen, pancreas, and adrenals appear normal.

There is a cyst arising from the posterior aspect of the left kidney
measuring 2.4 x 1.5 cm. There is no other renal mass. There is no
hydronephrosis on either side. There is no renal or ureteral
calculus on either side.

In the pelvis, the urinary bladder is midline with normal wall
thickness. There appears to be a hydrocele in the right scrotum.
There is no pelvic mass or pelvic fluid collection. There are
scattered sigmoid diverticula without diverticulitis. The appendix
is absent.

There is no appreciable bowel obstruction. No free air or portal
venous air. There is no appreciable ascites, adenopathy, or abscess
in the abdomen or pelvis. There is no abdominal aortic aneurysm.
There are no blastic or lytic bone lesions.
IMPRESSION: Small hiatal hernia with apparent gastroesophageal reflux.

No bowel obstruction.  No abscess.  Appendix absent.

No renal or ureteral calculus.  No hydronephrosis.

Scattered sigmoid diverticula without diverticulitis.

Suspect right scrotal hydrocele.

## 2018-03-22 DIAGNOSIS — Z0000 Encounter for general adult medical examination without abnormal findings: Secondary | ICD-10-CM | POA: Diagnosis not present

## 2018-03-22 DIAGNOSIS — E7800 Pure hypercholesterolemia, unspecified: Secondary | ICD-10-CM | POA: Diagnosis not present

## 2018-03-22 DIAGNOSIS — Z23 Encounter for immunization: Secondary | ICD-10-CM | POA: Diagnosis not present

## 2018-03-22 DIAGNOSIS — R972 Elevated prostate specific antigen [PSA]: Secondary | ICD-10-CM | POA: Diagnosis not present

## 2018-04-09 DIAGNOSIS — R21 Rash and other nonspecific skin eruption: Secondary | ICD-10-CM | POA: Diagnosis not present

## 2018-04-09 DIAGNOSIS — J209 Acute bronchitis, unspecified: Secondary | ICD-10-CM | POA: Diagnosis not present

## 2018-04-09 DIAGNOSIS — J069 Acute upper respiratory infection, unspecified: Secondary | ICD-10-CM | POA: Diagnosis not present

## 2018-05-16 DIAGNOSIS — R740 Nonspecific elevation of levels of transaminase and lactic acid dehydrogenase [LDH]: Secondary | ICD-10-CM | POA: Diagnosis not present

## 2018-06-11 DIAGNOSIS — Z23 Encounter for immunization: Secondary | ICD-10-CM | POA: Diagnosis not present

## 2022-06-11 DIAGNOSIS — B9689 Other specified bacterial agents as the cause of diseases classified elsewhere: Secondary | ICD-10-CM | POA: Diagnosis not present

## 2022-06-11 DIAGNOSIS — J069 Acute upper respiratory infection, unspecified: Secondary | ICD-10-CM | POA: Diagnosis not present

## 2022-06-22 DIAGNOSIS — Z0100 Encounter for examination of eyes and vision without abnormal findings: Secondary | ICD-10-CM | POA: Diagnosis not present

## 2022-06-22 DIAGNOSIS — H5213 Myopia, bilateral: Secondary | ICD-10-CM | POA: Diagnosis not present

## 2022-07-04 DIAGNOSIS — J101 Influenza due to other identified influenza virus with other respiratory manifestations: Secondary | ICD-10-CM | POA: Diagnosis not present

## 2022-11-02 DIAGNOSIS — H5213 Myopia, bilateral: Secondary | ICD-10-CM | POA: Diagnosis not present

## 2022-11-02 DIAGNOSIS — H53143 Visual discomfort, bilateral: Secondary | ICD-10-CM | POA: Diagnosis not present

## 2022-11-02 DIAGNOSIS — H2513 Age-related nuclear cataract, bilateral: Secondary | ICD-10-CM | POA: Diagnosis not present

## 2022-11-02 DIAGNOSIS — H524 Presbyopia: Secondary | ICD-10-CM | POA: Diagnosis not present

## 2022-11-02 DIAGNOSIS — H52223 Regular astigmatism, bilateral: Secondary | ICD-10-CM | POA: Diagnosis not present

## 2023-03-07 DIAGNOSIS — H2511 Age-related nuclear cataract, right eye: Secondary | ICD-10-CM | POA: Diagnosis not present

## 2023-03-07 DIAGNOSIS — H25013 Cortical age-related cataract, bilateral: Secondary | ICD-10-CM | POA: Diagnosis not present

## 2023-03-07 DIAGNOSIS — H25043 Posterior subcapsular polar age-related cataract, bilateral: Secondary | ICD-10-CM | POA: Diagnosis not present

## 2023-03-07 DIAGNOSIS — H2513 Age-related nuclear cataract, bilateral: Secondary | ICD-10-CM | POA: Diagnosis not present

## 2023-03-07 DIAGNOSIS — H18413 Arcus senilis, bilateral: Secondary | ICD-10-CM | POA: Diagnosis not present

## 2023-03-10 DIAGNOSIS — G8929 Other chronic pain: Secondary | ICD-10-CM | POA: Diagnosis not present

## 2023-03-10 DIAGNOSIS — Z008 Encounter for other general examination: Secondary | ICD-10-CM | POA: Diagnosis not present

## 2023-03-10 DIAGNOSIS — F329 Major depressive disorder, single episode, unspecified: Secondary | ICD-10-CM | POA: Diagnosis not present

## 2023-03-10 DIAGNOSIS — H269 Unspecified cataract: Secondary | ICD-10-CM | POA: Diagnosis not present

## 2023-03-10 DIAGNOSIS — E785 Hyperlipidemia, unspecified: Secondary | ICD-10-CM | POA: Diagnosis not present

## 2023-03-10 DIAGNOSIS — I10 Essential (primary) hypertension: Secondary | ICD-10-CM | POA: Diagnosis not present

## 2023-03-10 DIAGNOSIS — Z809 Family history of malignant neoplasm, unspecified: Secondary | ICD-10-CM | POA: Diagnosis not present

## 2023-03-14 DIAGNOSIS — L814 Other melanin hyperpigmentation: Secondary | ICD-10-CM | POA: Diagnosis not present

## 2023-03-14 DIAGNOSIS — L821 Other seborrheic keratosis: Secondary | ICD-10-CM | POA: Diagnosis not present

## 2023-03-14 DIAGNOSIS — D229 Melanocytic nevi, unspecified: Secondary | ICD-10-CM | POA: Diagnosis not present

## 2023-03-14 DIAGNOSIS — L570 Actinic keratosis: Secondary | ICD-10-CM | POA: Diagnosis not present

## 2023-03-14 DIAGNOSIS — L578 Other skin changes due to chronic exposure to nonionizing radiation: Secondary | ICD-10-CM | POA: Diagnosis not present

## 2023-05-11 DIAGNOSIS — L309 Dermatitis, unspecified: Secondary | ICD-10-CM | POA: Diagnosis not present

## 2023-05-11 DIAGNOSIS — E7800 Pure hypercholesterolemia, unspecified: Secondary | ICD-10-CM | POA: Diagnosis not present

## 2023-05-11 DIAGNOSIS — R7303 Prediabetes: Secondary | ICD-10-CM | POA: Diagnosis not present

## 2023-05-11 DIAGNOSIS — K5730 Diverticulosis of large intestine without perforation or abscess without bleeding: Secondary | ICD-10-CM | POA: Diagnosis not present

## 2023-05-11 DIAGNOSIS — F411 Generalized anxiety disorder: Secondary | ICD-10-CM | POA: Diagnosis not present

## 2023-05-11 DIAGNOSIS — Z0000 Encounter for general adult medical examination without abnormal findings: Secondary | ICD-10-CM | POA: Diagnosis not present

## 2023-05-11 DIAGNOSIS — R7309 Other abnormal glucose: Secondary | ICD-10-CM | POA: Diagnosis not present

## 2023-05-11 DIAGNOSIS — Z130 Encounter for screening for diseases of the blood and blood-forming organs and certain disorders involving the immune mechanism: Secondary | ICD-10-CM | POA: Diagnosis not present

## 2023-05-11 DIAGNOSIS — Z23 Encounter for immunization: Secondary | ICD-10-CM | POA: Diagnosis not present

## 2023-05-11 DIAGNOSIS — M109 Gout, unspecified: Secondary | ICD-10-CM | POA: Diagnosis not present

## 2023-05-11 DIAGNOSIS — R972 Elevated prostate specific antigen [PSA]: Secondary | ICD-10-CM | POA: Diagnosis not present

## 2023-05-15 DIAGNOSIS — H2511 Age-related nuclear cataract, right eye: Secondary | ICD-10-CM | POA: Diagnosis not present

## 2023-05-16 DIAGNOSIS — H2512 Age-related nuclear cataract, left eye: Secondary | ICD-10-CM | POA: Diagnosis not present

## 2023-05-29 DIAGNOSIS — H52202 Unspecified astigmatism, left eye: Secondary | ICD-10-CM | POA: Diagnosis not present

## 2023-05-29 DIAGNOSIS — H2512 Age-related nuclear cataract, left eye: Secondary | ICD-10-CM | POA: Diagnosis not present

## 2023-06-22 DIAGNOSIS — E7800 Pure hypercholesterolemia, unspecified: Secondary | ICD-10-CM | POA: Diagnosis not present

## 2023-06-22 DIAGNOSIS — R972 Elevated prostate specific antigen [PSA]: Secondary | ICD-10-CM | POA: Diagnosis not present

## 2023-09-12 DIAGNOSIS — H04123 Dry eye syndrome of bilateral lacrimal glands: Secondary | ICD-10-CM | POA: Diagnosis not present

## 2023-09-12 DIAGNOSIS — Z9849 Cataract extraction status, unspecified eye: Secondary | ICD-10-CM | POA: Diagnosis not present

## 2023-09-21 DIAGNOSIS — M25562 Pain in left knee: Secondary | ICD-10-CM | POA: Diagnosis not present

## 2023-10-03 DIAGNOSIS — M25562 Pain in left knee: Secondary | ICD-10-CM | POA: Diagnosis not present

## 2023-10-10 DIAGNOSIS — M25562 Pain in left knee: Secondary | ICD-10-CM | POA: Diagnosis not present

## 2023-10-17 DIAGNOSIS — M25562 Pain in left knee: Secondary | ICD-10-CM | POA: Diagnosis not present

## 2023-10-30 DIAGNOSIS — E7800 Pure hypercholesterolemia, unspecified: Secondary | ICD-10-CM | POA: Diagnosis not present

## 2023-12-31 DIAGNOSIS — E7800 Pure hypercholesterolemia, unspecified: Secondary | ICD-10-CM | POA: Diagnosis not present

## 2024-01-09 DIAGNOSIS — E7800 Pure hypercholesterolemia, unspecified: Secondary | ICD-10-CM | POA: Diagnosis not present

## 2024-01-09 DIAGNOSIS — R972 Elevated prostate specific antigen [PSA]: Secondary | ICD-10-CM | POA: Diagnosis not present

## 2024-01-09 DIAGNOSIS — F411 Generalized anxiety disorder: Secondary | ICD-10-CM | POA: Diagnosis not present

## 2024-01-16 ENCOUNTER — Other Ambulatory Visit: Payer: Self-pay | Admitting: Medical Genetics

## 2024-01-30 DIAGNOSIS — E7800 Pure hypercholesterolemia, unspecified: Secondary | ICD-10-CM | POA: Diagnosis not present

## 2024-02-06 ENCOUNTER — Other Ambulatory Visit

## 2024-02-06 DIAGNOSIS — Z006 Encounter for examination for normal comparison and control in clinical research program: Secondary | ICD-10-CM

## 2024-02-17 LAB — GENECONNECT MOLECULAR SCREEN: Genetic Analysis Overall Interpretation: NEGATIVE

## 2024-03-01 DIAGNOSIS — E7800 Pure hypercholesterolemia, unspecified: Secondary | ICD-10-CM | POA: Diagnosis not present

## 2024-03-31 DIAGNOSIS — E7800 Pure hypercholesterolemia, unspecified: Secondary | ICD-10-CM | POA: Diagnosis not present
